# Patient Record
Sex: Male | Born: 1940 | Race: White | Hispanic: No | Smoking: Current some day smoker
Health system: Southern US, Community
[De-identification: ages and names within clinical notes are randomized; demographics above are authoritative.]

## PROBLEM LIST (undated history)

## (undated) DIAGNOSIS — B0050 Herpesviral ocular disease, unspecified: Secondary | ICD-10-CM

## (undated) DIAGNOSIS — E785 Hyperlipidemia, unspecified: Secondary | ICD-10-CM

## (undated) DIAGNOSIS — I4891 Unspecified atrial fibrillation: Secondary | ICD-10-CM

## (undated) DIAGNOSIS — I119 Hypertensive heart disease without heart failure: Secondary | ICD-10-CM

## (undated) DIAGNOSIS — I2510 Atherosclerotic heart disease of native coronary artery without angina pectoris: Secondary | ICD-10-CM

## (undated) DIAGNOSIS — I255 Ischemic cardiomyopathy: Secondary | ICD-10-CM

## (undated) DIAGNOSIS — I513 Intracardiac thrombosis, not elsewhere classified: Secondary | ICD-10-CM

## (undated) DIAGNOSIS — Z95810 Presence of automatic (implantable) cardiac defibrillator: Secondary | ICD-10-CM

## (undated) DIAGNOSIS — E119 Type 2 diabetes mellitus without complications: Secondary | ICD-10-CM

## (undated) HISTORY — PX: RETINAL DETACHMENT SURGERY: SHX105

## (undated) HISTORY — PX: CORNEAL TRANSPLANT: SHX108

## (undated) HISTORY — PX: ANAL FISSURECTOMY: SUR608

---

## 1999-07-14 HISTORY — PX: CORONARY ANGIOPLASTY WITH STENT PLACEMENT: SHX49

## 2011-07-24 DIAGNOSIS — I4891 Unspecified atrial fibrillation: Secondary | ICD-10-CM | POA: Diagnosis not present

## 2011-07-24 DIAGNOSIS — I2510 Atherosclerotic heart disease of native coronary artery without angina pectoris: Secondary | ICD-10-CM | POA: Diagnosis not present

## 2011-11-23 ENCOUNTER — Ambulatory Visit
Admission: RE | Admit: 2011-11-23 | Discharge: 2011-11-23 | Disposition: A | Discharge status: Home / Self Care | Payer: Medicare Other | Source: Ambulatory Visit | Attending: Cardiology | Admitting: Cardiology

## 2011-11-23 ENCOUNTER — Other Ambulatory Visit: Payer: Self-pay | Admitting: Cardiology

## 2011-11-23 DIAGNOSIS — E785 Hyperlipidemia, unspecified: Secondary | ICD-10-CM | POA: Diagnosis not present

## 2011-11-23 DIAGNOSIS — I2510 Atherosclerotic heart disease of native coronary artery without angina pectoris: Secondary | ICD-10-CM | POA: Diagnosis not present

## 2011-11-23 DIAGNOSIS — I10 Essential (primary) hypertension: Secondary | ICD-10-CM | POA: Diagnosis not present

## 2011-11-23 DIAGNOSIS — I2589 Other forms of chronic ischemic heart disease: Secondary | ICD-10-CM | POA: Diagnosis not present

## 2011-11-23 DIAGNOSIS — E889 Metabolic disorder, unspecified: Secondary | ICD-10-CM | POA: Diagnosis not present

## 2011-11-23 DIAGNOSIS — I43 Cardiomyopathy in diseases classified elsewhere: Secondary | ICD-10-CM | POA: Diagnosis not present

## 2011-11-23 DIAGNOSIS — I517 Cardiomegaly: Secondary | ICD-10-CM | POA: Diagnosis not present

## 2011-11-23 DIAGNOSIS — I4891 Unspecified atrial fibrillation: Secondary | ICD-10-CM | POA: Diagnosis not present

## 2011-12-02 DIAGNOSIS — I2510 Atherosclerotic heart disease of native coronary artery without angina pectoris: Secondary | ICD-10-CM | POA: Diagnosis not present

## 2011-12-02 DIAGNOSIS — I2589 Other forms of chronic ischemic heart disease: Secondary | ICD-10-CM | POA: Diagnosis not present

## 2011-12-02 DIAGNOSIS — E785 Hyperlipidemia, unspecified: Secondary | ICD-10-CM | POA: Diagnosis not present

## 2011-12-02 DIAGNOSIS — I10 Essential (primary) hypertension: Secondary | ICD-10-CM | POA: Diagnosis not present

## 2011-12-02 DIAGNOSIS — I4891 Unspecified atrial fibrillation: Secondary | ICD-10-CM | POA: Diagnosis not present

## 2012-05-24 DIAGNOSIS — I10 Essential (primary) hypertension: Secondary | ICD-10-CM | POA: Diagnosis not present

## 2012-05-24 DIAGNOSIS — I2510 Atherosclerotic heart disease of native coronary artery without angina pectoris: Secondary | ICD-10-CM | POA: Diagnosis not present

## 2012-05-24 DIAGNOSIS — I4891 Unspecified atrial fibrillation: Secondary | ICD-10-CM | POA: Diagnosis not present

## 2012-05-24 DIAGNOSIS — E785 Hyperlipidemia, unspecified: Secondary | ICD-10-CM | POA: Diagnosis not present

## 2012-05-24 DIAGNOSIS — I2589 Other forms of chronic ischemic heart disease: Secondary | ICD-10-CM | POA: Diagnosis not present

## 2012-07-30 ENCOUNTER — Emergency Department (HOSPITAL_COMMUNITY): Payer: Medicare Other

## 2012-07-30 ENCOUNTER — Inpatient Hospital Stay (HOSPITAL_COMMUNITY)
Admission: EM | Admit: 2012-07-30 | Discharge: 2012-08-03 | DRG: 225 | Disposition: A | Discharge status: Home / Self Care | Payer: Medicare Other | Attending: Cardiology | Admitting: Cardiology

## 2012-07-30 ENCOUNTER — Encounter (HOSPITAL_COMMUNITY): Payer: Self-pay | Admitting: *Deleted

## 2012-07-30 DIAGNOSIS — I469 Cardiac arrest, cause unspecified: Secondary | ICD-10-CM | POA: Diagnosis not present

## 2012-07-30 DIAGNOSIS — E785 Hyperlipidemia, unspecified: Secondary | ICD-10-CM | POA: Insufficient documentation

## 2012-07-30 DIAGNOSIS — I255 Ischemic cardiomyopathy: Secondary | ICD-10-CM

## 2012-07-30 DIAGNOSIS — I4901 Ventricular fibrillation: Secondary | ICD-10-CM | POA: Diagnosis not present

## 2012-07-30 DIAGNOSIS — I119 Hypertensive heart disease without heart failure: Secondary | ICD-10-CM | POA: Diagnosis present

## 2012-07-30 DIAGNOSIS — S0083XA Contusion of other part of head, initial encounter: Secondary | ICD-10-CM | POA: Diagnosis present

## 2012-07-30 DIAGNOSIS — I4891 Unspecified atrial fibrillation: Secondary | ICD-10-CM | POA: Diagnosis present

## 2012-07-30 DIAGNOSIS — I2589 Other forms of chronic ischemic heart disease: Secondary | ICD-10-CM | POA: Diagnosis not present

## 2012-07-30 DIAGNOSIS — Z95810 Presence of automatic (implantable) cardiac defibrillator: Secondary | ICD-10-CM | POA: Diagnosis not present

## 2012-07-30 DIAGNOSIS — R404 Transient alteration of awareness: Secondary | ICD-10-CM | POA: Diagnosis not present

## 2012-07-30 DIAGNOSIS — I700 Atherosclerosis of aorta: Secondary | ICD-10-CM | POA: Diagnosis not present

## 2012-07-30 DIAGNOSIS — S0003XA Contusion of scalp, initial encounter: Secondary | ICD-10-CM | POA: Diagnosis present

## 2012-07-30 DIAGNOSIS — I499 Cardiac arrhythmia, unspecified: Secondary | ICD-10-CM | POA: Diagnosis not present

## 2012-07-30 DIAGNOSIS — S00439A Contusion of unspecified ear, initial encounter: Secondary | ICD-10-CM | POA: Diagnosis not present

## 2012-07-30 DIAGNOSIS — I2510 Atherosclerotic heart disease of native coronary artery without angina pectoris: Secondary | ICD-10-CM | POA: Diagnosis present

## 2012-07-30 DIAGNOSIS — W19XXXA Unspecified fall, initial encounter: Secondary | ICD-10-CM | POA: Diagnosis present

## 2012-07-30 DIAGNOSIS — I513 Intracardiac thrombosis, not elsewhere classified: Secondary | ICD-10-CM | POA: Insufficient documentation

## 2012-07-30 DIAGNOSIS — I472 Ventricular tachycardia: Secondary | ICD-10-CM | POA: Diagnosis present

## 2012-07-30 DIAGNOSIS — R55 Syncope and collapse: Secondary | ICD-10-CM

## 2012-07-30 DIAGNOSIS — E119 Type 2 diabetes mellitus without complications: Secondary | ICD-10-CM | POA: Diagnosis present

## 2012-07-30 DIAGNOSIS — R0681 Apnea, not elsewhere classified: Secondary | ICD-10-CM | POA: Diagnosis not present

## 2012-07-30 HISTORY — DX: Unspecified atrial fibrillation: I48.91

## 2012-07-30 HISTORY — DX: Ischemic cardiomyopathy: I25.5

## 2012-07-30 HISTORY — DX: Atherosclerotic heart disease of native coronary artery without angina pectoris: I25.10

## 2012-07-30 HISTORY — DX: Intracardiac thrombosis, not elsewhere classified: I51.3

## 2012-07-30 HISTORY — DX: Presence of automatic (implantable) cardiac defibrillator: Z95.810

## 2012-07-30 HISTORY — DX: Herpesviral ocular disease, unspecified: B00.50

## 2012-07-30 HISTORY — DX: Type 2 diabetes mellitus without complications: E11.9

## 2012-07-30 HISTORY — DX: Hyperlipidemia, unspecified: E78.5

## 2012-07-30 HISTORY — DX: Hypertensive heart disease without heart failure: I11.9

## 2012-07-30 LAB — COMPREHENSIVE METABOLIC PANEL
AST: 20 U/L (ref 0–37)
Alkaline Phosphatase: 61 U/L (ref 39–117)
BUN: 18 mg/dL (ref 6–23)
CO2: 25 mEq/L (ref 19–32)
Calcium: 8.9 mg/dL (ref 8.4–10.5)
Chloride: 98 mEq/L (ref 96–112)
Creatinine, Ser: 1.23 mg/dL (ref 0.50–1.35)
Creatinine, Ser: 1.17 mg/dL (ref 0.50–1.35)
GFR calc Af Amer: 65 mL/min — ABNORMAL LOW (ref >90)
GFR calc Af Amer: 70 mL/min — ABNORMAL LOW (ref >90)
GFR calc non Af Amer: 56 mL/min — ABNORMAL LOW (ref >90)
Glucose, Bld: 251 mg/dL — ABNORMAL HIGH (ref 70–99)
Glucose, Bld: 204 mg/dL — ABNORMAL HIGH (ref 70–99)
Potassium: 4.5 mEq/L (ref 3.5–5.1)
Total Bilirubin: 0.8 mg/dL (ref 0.3–1.2)
Total Protein: 6 g/dL (ref 6.0–8.3)

## 2012-07-30 LAB — CBC
MCH: 31.6 pg (ref 26.0–34.0)
MCHC: 35.2 g/dL (ref 30.0–36.0)
MCV: 89.9 fL (ref 78.0–100.0)
Platelets: 171 10*3/uL (ref 150–400)
RBC: 4.93 MIL/uL (ref 4.22–5.81)

## 2012-07-30 LAB — HEMOGLOBIN A1C: Hgb A1c MFr Bld: 7.6 % — ABNORMAL HIGH (ref <5.7)

## 2012-07-30 LAB — MRSA PCR SCREENING: MRSA by PCR: NEGATIVE

## 2012-07-30 LAB — PROTIME-INR: INR: 1.21 (ref 0.00–1.49)

## 2012-07-30 LAB — MAGNESIUM: Magnesium: 1.8 mg/dL (ref 1.5–2.5)

## 2012-07-30 MED ORDER — HEPARIN BOLUS VIA INFUSION
4000.0000 [IU] | Freq: Once | INTRAVENOUS | Status: AC
  Administered 2012-07-30: 4000 [IU] via INTRAVENOUS

## 2012-07-30 MED ORDER — ASPIRIN 325 MG PO TABS
325.0000 mg | ORAL_TABLET | Freq: Once | ORAL | Status: AC
  Administered 2012-07-30: 325 mg via ORAL
  Filled 2012-07-30: qty 1

## 2012-07-30 MED ORDER — HEPARIN (PORCINE) IN NACL 100-0.45 UNIT/ML-% IJ SOLN
1200.0000 [IU]/h | INTRAMUSCULAR | Status: DC
  Administered 2012-07-30 – 2012-08-01 (×3): 1200 [IU]/h via INTRAVENOUS
  Filled 2012-07-30 (×3): qty 250

## 2012-07-30 MED ORDER — ASPIRIN EC 81 MG PO TBEC
81.0000 mg | DELAYED_RELEASE_TABLET | Freq: Every day | ORAL | Status: DC
  Administered 2012-07-31: 81 mg via ORAL
  Filled 2012-07-30 (×3): qty 1

## 2012-07-30 MED ORDER — METOPROLOL TARTRATE 50 MG PO TABS
50.0000 mg | ORAL_TABLET | Freq: Two times a day (BID) | ORAL | Status: DC
  Administered 2012-07-30 – 2012-08-01 (×6): 50 mg via ORAL
  Filled 2012-07-30 (×4): qty 1
  Filled 2012-07-30: qty 2
  Filled 2012-07-30 (×3): qty 1

## 2012-07-30 MED ORDER — ACETAMINOPHEN 325 MG PO TABS: 650.0000 mg | ORAL_TABLET | ORAL | Status: DC | PRN

## 2012-07-30 MED ORDER — INFLUENZA VIRUS VACC SPLIT PF IM SUSP
0.5000 mL | INTRAMUSCULAR | Status: AC
  Administered 2012-07-31: 0.5 mL via INTRAMUSCULAR
  Filled 2012-07-30: qty 0.5

## 2012-07-30 MED ORDER — PNEUMOCOCCAL VAC POLYVALENT 25 MCG/0.5ML IJ INJ
0.5000 mL | INJECTION | INTRAMUSCULAR | Status: AC
  Administered 2012-07-31: 0.5 mL via INTRAMUSCULAR
  Filled 2012-07-30: qty 0.5

## 2012-07-30 MED ORDER — SODIUM CHLORIDE 0.9 % IJ SOLN: 3.0000 mL | INTRAMUSCULAR | Status: DC | PRN

## 2012-07-30 MED ORDER — SODIUM CHLORIDE 0.9 % IJ SOLN
3.0000 mL | Freq: Two times a day (BID) | INTRAMUSCULAR | Status: DC
  Administered 2012-07-30 – 2012-08-03 (×7): 3 mL via INTRAVENOUS

## 2012-07-30 MED ORDER — ONDANSETRON HCL 4 MG/2ML IJ SOLN: 4.0000 mg | Freq: Four times a day (QID) | INTRAMUSCULAR | Status: DC | PRN

## 2012-07-30 MED ORDER — SODIUM CHLORIDE 0.9 % IV SOLN
250.0000 mL | INTRAVENOUS | Status: DC | PRN
  Administered 2012-07-30: 250 mL via INTRAVENOUS

## 2012-07-30 MED ORDER — NITROGLYCERIN 0.4 MG SL SUBL: 0.4000 mg | SUBLINGUAL_TABLET | SUBLINGUAL | Status: DC | PRN

## 2012-07-30 MED ORDER — NICOTINE 14 MG/24HR TD PT24
14.0000 mg | MEDICATED_PATCH | Freq: Every day | TRANSDERMAL | Status: DC
  Administered 2012-07-30 – 2012-08-03 (×5): 14 mg via TRANSDERMAL
  Filled 2012-07-30 (×5): qty 1

## 2012-07-30 NOTE — ED Notes (Signed)
Dr Donnie Aho here to eval pt

## 2012-07-30 NOTE — Consult Note (Signed)
ANTICOAGULATION CONSULT NOTE - F/U Consult  Pharmacy Consult for Heparin Indication: chest pain/ACS  Allergies  Allergen Reactions  . Lopid (Gemfibrozil)     "Ate away my muscles"  . Pradaxa (Dabigatran Etexilate Mesylate) Other (See Comments)    unknown    Patient Measurements: Height: 5\' 11"  (180.3 cm) Weight: 205 lb (92.987 kg) IBW/kg (Calculated) : 75.3  Heparin Dosing Weight: 92kg  Vital Signs: Temp: 97.5 F (36.4 C) (01/18 1938) Temp src: Oral (01/18 1938) BP: 118/77 mmHg (01/18 1700) Pulse Rate: 101  (01/18 2000)  Labs:  Basename 07/30/12 1933 07/30/12 1834 07/30/12 1251 07/30/12 1250 07/30/12 1032 07/30/12 1021  HGB -- -- -- -- -- 15.6  HCT -- -- -- -- -- 44.3  PLT -- -- -- -- -- 171  APTT -- -- -- 161* -- --  LABPROT -- -- -- -- -- 15.1  INR -- -- -- -- -- 1.21  HEPARINUNFRC 0.32 -- -- -- -- --  CREATININE -- -- -- 1.17 -- 1.23  CKTOTAL -- -- -- -- -- --  CKMB -- -- -- -- -- --  TROPONINI -- <0.30 <0.30 -- <0.30 --    Estimated Creatinine Clearance: 65.5 ml/min (by C-G formula based on Cr of 1.17).   Medical History: Past Medical History  Diagnosis Date  . CAD (coronary artery disease) 07/30/2012    Inferior MI 2001 Stent to LAD and circ 2001 Refused cath 2008 in [place] Ischemic cardiomyopathy   . Ischemic cardiomyopathy 07/30/2012    Present since 2001 EKF 20% by ECHO 12/02/11   . Atrial fibrillation     Diagnosed 2008, Tried Pradaxa and Xarelto that caused hematuria Refuses warfarin   . Hypertensive heart disease without CHF   . Left atrial thrombus     He developed atrial fibrillation 2008 and was found to have a mass in his atrium on a CT scan. Confirmed by TEE that also showed spontaneous contrast.  He was placed on Pradaxa and had 2 TEE's  that eventually showed some reduction in the size of his thrombus but he refused to have a third TEE. He was taken off of Pradaxa and later placed on Xarelto but developed hematuria on Xarelto and quit taking  this. He refused to consider going on warfarin and was willing to take the risks of stroke being on just aspirin alone   . Hyperlipidemia   . Herpes simplex of eye     Medications:  No anticoagulants pta  Assessment: 71yom with hx CAD s/p 4 stents and afib is now s/p cardiac arrest with return of pulses after 2 rounds of CPR and 2 shocks. Heparin level 0.32 in gaol.  Goal of Therapy:  Heparin level 0.3-0.7 units/ml Monitor platelets by anticoagulation protocol: Yes   Plan:  Next HL with am labs. Continue heparin 1200 units/hr  Misty Stanley Stillinger 07/30/2012,8:41 PM

## 2012-07-30 NOTE — H&P (Signed)
Tyler Cameron    Date of visit:  07/30/2012 DOB:  02/08/1941    Age:  71 yrs. Medical record number:  [id number]     Account number:  [id number] Primary Care Provider: Erick Blinks  CURRENT DIAGNOSES  1. Cardiac Arrest  2. Cardiomyopathy Ischemic  3. CAD,Native  4. Arrhythmia-Atrial Fibrillation  5. Hyperlipidemia  6. Hypertension,Essential (Benign)  ALLERGIES  Lopid, Muscle weakness  Pradaxa, Heartburn  Reopro, Thrombocytopenia  Xarelto, Hematuria  MEDICATIONS  1. nitroglycerin 0.4 mg tablet, sublingual, PRN  2. triamcinolone acetonide 0.1 % Ointment, PRN  3. multivitamin tablet, 1 p.o. daily  4. aspirin 81 mg tablet, chewable, 1 p.o. daily  5. metoprolol tartrate 50 mg tablet, BID  6. digoxin 125 mcg tablet, 1 p.o. daily  7. lisinopril 10 mg tablet, 1 p.o. daily  8. pravastatin 40 mg tablet, 1 p.o. daily  HISTORY OF PRESENT ILLNESS  This 72 year old male is admitted to the hospital following out of hospital cardiac arrest. He moved here from [place] where he previously lived in a cardiac chair and establish care with me in May. His cardiac history dates back to 2001 when he was found to have coronary disease and stenting of the LAD and was noted to have an occluded right coronary artery. He has had depressed LV function since then and has refused to have a defibrillator placed for prevention of sudden death. He developed atrial fibrillation in 2008. During that hospitalization he refused to have a catheterization and he has had serial echocardiograms that showed ejection fractions in the 20s. He developed atrial fibrillation several years ago and was found to have a mass in his atrium incidentally on a CT scan. He was placed on Pradaxa and had 2 TEE 's that eventually showed some reduction in the size of his thrombus but he refused to have a third TEE. He was taken off of Pradaxa and later placed on Xarelto but developed hematuria on Xarelto and he quit taking this. He refused to  consider going on warfarin and was willing to take the risks of stroke being on just aspirin alone. He evidently had a Cardiolite test done a year ago that did not show significant ischemia but did show an ejection fraction of 22%.  He had an echocardiogram done in May of 2013 that showed an ejection fraction of 20%. I have spoken to him twice about having a defibrillator he stated that he was not interested in this. He stated he was asymptomatic at the time that I saw him in November and again stressed a wish not to have a defibrillator. I suggested taking a statin drug at the time.  He was in his usual state of health and went with his family to the aquatics event [place] today. After walking briskly from the parking lot he complained of some dyspnea and then had a syncopal episode. A physician was present and he was found to be in ventricular fibrillation and was resuscitated after having had 2 bouts of CPR. He was found to have lateral ST depression. On awakening he had no chest discomfort and other than feeling sore in his chest stated that he felt reasonably well. He did fell and struck his head and had some slight bleeding from the pinna of his ear.  Last week he felt fine except he had a mild GI disorder for which he took some castor oil and Pepcid is small. He has had no recent changes in his medication. He denies exertional angina and  has no PND, orthopnea or claudication.  PAST HISTORY  Past Medical Illnesses:  hypertension, hyperlipidemia, history of herpes simplex of eye;  Cardiovascular Illnesses:  CAD, atrial fibrillation, cardiomyopathy(ischemic), history of left atrial thrombus;  Surgical Procedures:  anal fissurotomy, corneal transplant, detachet retina;  NYHA Classification:  II;  Cardiology Procedures-Invasive:  cardiac cath (left), stent to LAD and circ 2001;  Cardiology Procedures-Noninvasive:  lexiscan cardiolite April 2010, TEE October 2011, echocardiogram May 2013;  LVEF of 20%  documented via echocardiogram on 12/02/2011  CARDIO-PULMONARY TEST DATES EKG Date:  11/23/2011;  Nuclear Study Date:  10/24/2010;  Echocardiography Date: 12/02/2011;  Chest Xray Date: 11/23/2011;    FAMILY HISTORY Father - age 54,  died of heart disease; Mother - age 43,  deceased; Brother 1 - age 3,  alive and well; Sister 1 - age 70,  alive and well and  diabetes-unknown type; Sister 2 - age 55,  alive and well; Sister 75 - age 20, died of drug and seld abuse;   SOCIAL HISTORY Alcohol Use:  occasionally;  Smoking:  50 pack year history;  Diet:  regular diet;  Lifestyle:  separated;  Exercise:  treadmill for approximately 30 minutes 3 days per week;  Occupation:  retired [employer]or, [employer];  Residence:  lives alone;    REVIEW OF SYSTEMS General:  denies recent weight change, fatique or change in exercise tolerance.Integumentary:  dry skin  Eyes:  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 Respirations:  12/min. Weight:  206.00 lbs. Height:  71"BMI: 29  Constitutional:  pleasant bearded white male in no acute distress Skin:  hematoma of right pinna Head:  normocephalic, normal hair pattern, no masses or tenderness Eyes:  left eye cornea cloudy ENT:  ears, nose and throat reveal no gross abnormalities.  Dentition good. Neck:  supple, without massess. No JVD, thyromegaly or carotid bruits. Carotid upstroke normal. Chest:  normal symmetry, clear to auscultation and percussion. Cardiac:  irregularly irregular rhythm, normal S1 and S2, no S3 or S4, no murmur Abdomen:  abdomen  soft,non-tender, no masses, no hepatospenomegaly, or aneurysm noted Peripheral Pulses:  pulses below the femoral arteries are diminished Extremities & Back:  no deformities, clubbing, cyanosis, erythema or edema observed. Normal muscle strength and tone. Neurological:  no gross motor or sensory deficits noted, affect appropriate, oriented x3.  MOST RECENT LIPID PANEL 12/02/11  CHOL TOTL 196 mg/dl, LDL 409 calc, HDL 35 mg/dl, TRIGLYCER 811 mg/dl and CHOL/HDL 5.6 (Calc)  IMPRESSIONS/PLAN  1. Out-of-hospital cardiac arrest with successful resuscitation 2. Ischemic cardiomyopathy 3. Coronary artery disease with previous inferior infarction and multiple stenting 4. Hypertensive heart disease 5. Hyperlipidemia with statin and gemfibrozil intolerance 6. Chronic atrial fibrillation 7. History of left atrial thrombus and spontaneous echo contrast  Recommendations:  Likely had a ventricular arrhythmia due to his LV dysfunction. He will be placed on intravenous heparin. He will have serial cardiac enzymes. Ideally he would need to have a repeat catheterization to assess for recurrent ischemia and would be recommended to have a implantable defibrillator if he is willing to have one.   Cardiology Physician:  Darden Palmer MD [facility]

## 2012-07-30 NOTE — Consult Note (Signed)
ANTICOAGULATION CONSULT NOTE - Initial Consult  Pharmacy Consult for Heparin Indication: chest pain/ACS  Allergies  Allergen Reactions  . Lopid (Gemfibrozil)     "Ate away my muscles"    Patient Measurements: Height: 5\' 11"  (180.3 cm) Weight: 205 lb (92.987 kg) IBW/kg (Calculated) : 75.3  Heparin Dosing Weight: 92kg  Vital Signs: Temp: 97.4 F (36.3 C) (01/18 1022) Temp src: Oral (01/18 1022) BP: 100/66 mmHg (01/18 1022) Pulse Rate: 79  (01/18 1022)  Labs: No results found for this basename: HGB:2,HCT:3,PLT:3,APTT:3,LABPROT:3,INR:3,HEPARINUNFRC:3,CREATININE:3,CKTOTAL:3,CKMB:3,TROPONINI:3 in the last 72 hours  CrCl is unknown because no creatinine reading has been taken.   Medical History: Past Medical History  Diagnosis Date  . A-fib   . Coronary artery disease   . Hypertension     Medications:  No anticoagulants pta  Assessment: 71yom with hx CAD s/p 4 stents and afib is now s/p cardiac arrest with return of pulses after 2 rounds of CPR and 2 shocks. He will begin heparin and may go to the cath lab later today. Baseline CBC, renal function wnl.  Of note, patient has been on both pradaxa and xarelto in the past. Pradaxa was stopped due to stomach cramps and xarelto was stopped due to "blood everywhere" per the patient.  Goal of Therapy:  Heparin level 0.3-0.7 units/ml Monitor platelets by anticoagulation protocol: Yes   Plan:  1) Heparin bolus 4000 units x 1 2) Heparin drip at 1200 units/hr 3) 8 hour heparin level vs f/u after cath 4) Daily heparin level and CBC  Fredrik Rigger 07/30/2012,10:56 AM

## 2012-07-30 NOTE — ED Provider Notes (Signed)
History     CSN: [id number]  Arrival date & time 07/30/12  1014   First MD Initiated Contact with Patient 07/30/12 1016      Chief Complaint  Patient presents with  . Cardiac Arrest     The history is provided by the patient and the EMS personnel.   the patient had a syncopal event and was noted to be pulseless and apneic.  CPR was initiated immediately the patient was related twice by an AED with return of spontaneous circulation.  The patient returned to normal mentation was alert and oriented without any complaints.  He denies preceding chest pain or chest tightness.  The patient does have a cardiac history with 4 stents in his heart placed in 2001.  EMS reports some lateral ST depression on EKG but no ST elevation.  The patient reports her last several days he's been without any complaints.  He has no complaints at this time.  Small hematoma and laceration noted to his right ear.  Patient arrived backboarded in c-collar.  The patient denies neck pain.  Weakness his upper lower extremities.  Is not on any anticoagulants except for an aspirin.  He denies headache or neck pain.  He has no recollection of the syncopal event.  Past Medical History  Diagnosis Date  . A-fib   . Coronary artery disease   . Hypertension     Past Surgical History  Procedure Date  . Coronary angioplasty with stent placement     History reviewed. No pertinent family history.  History  Substance Use Topics  . Smoking status: Not on file  . Smokeless tobacco: Not on file  . Alcohol Use:       Review of Systems  All other systems reviewed and are negative.    Allergies  Review of patient's allergies indicates no known allergies.  Home Medications  No current outpatient prescriptions on file.  BP 100/66  Pulse 79  Temp 97.4 F (36.3 C) (Oral)  Resp 33  SpO2 95%  Physical Exam  Nursing note and vitals reviewed. Constitutional: He is oriented to person, place, and time. He appears  well-developed and well-nourished.  HENT:  Head: Normocephalic and atraumatic.  Eyes: EOM are normal.  Neck: Normal range of motion.  Cardiovascular: Regular rhythm, normal heart sounds and intact distal pulses.        Irregularly irregular  Pulmonary/Chest: Effort normal and breath sounds normal. No respiratory distress.  Abdominal: Soft. He exhibits no distension. There is no tenderness.  Musculoskeletal: Normal range of motion.  Neurological: He is alert and oriented to person, place, and time.  Skin: Skin is warm and dry.  Psychiatric: He has a normal mood and affect. Judgment normal.    ED Course  Procedures (including critical care time)   Date: 07/30/2012  Rate: 87  Rhythm: normal sinus rhythm  QRS Axis: normal  Intervals: normal  ST/T Wave abnormalities: Lateral ST depression  Conduction Disutrbances: none  Narrative Interpretation:   Old EKG Reviewed: No prior EKG available   CRITICAL CARE Performed by: Lyanne Co Total critical care time: 30 Critical care time was exclusive of separately billable procedures and treating other patients. Critical care was necessary to treat or prevent imminent or life-threatening deterioration. Critical care was time spent personally by me on the following activities: development of treatment plan with patient and/or surrogate as well as nursing, discussions with consultants, evaluation of patient's response to treatment, examination of patient, obtaining history from patient or surrogate,  ordering and performing treatments and interventions, ordering and review of laboratory studies, ordering and review of radiographic studies, pulse oximetry and re-evaluation of patient's condition.   Labs Reviewed  COMPREHENSIVE METABOLIC PANEL - Abnormal; Notable for the following:    Glucose, Bld 251 (*)     Albumin 3.2 (*)     GFR calc non Af Amer 56 (*)     GFR calc Af Amer 65 (*)     All other components within normal limits  APTT -  Abnormal; Notable for the following:    aPTT 161 (*)     All other components within normal limits  TSH - Abnormal; Notable for the following:    TSH 4.610 (*)     All other components within normal limits  COMPREHENSIVE METABOLIC PANEL - Abnormal; Notable for the following:    Glucose, Bld 204 (*)     GFR calc non Af Amer 60 (*)     GFR calc Af Amer 70 (*)     All other components within normal limits  HEMOGLOBIN A1C - Abnormal; Notable for the following:    Hemoglobin A1C 7.6 (*)     Mean Plasma Glucose 171 (*)     All other components within normal limits  PRO B NATRIURETIC PEPTIDE - Abnormal; Notable for the following:    Pro B Natriuretic peptide (BNP) 1889.0 (*)     All other components within normal limits  CBC - Abnormal; Notable for the following:    WBC 11.6 (*)     All other components within normal limits  LIPID PANEL - Abnormal; Notable for the following:    HDL 37 (*)     LDL Cholesterol 113 (*)     All other components within normal limits  CBC  PROTIME-INR  MAGNESIUM  TROPONIN I  TROPONIN I  TROPONIN I  TROPONIN I  HEPARIN LEVEL (UNFRACTIONATED)  MRSA PCR SCREENING  HEPARIN LEVEL (UNFRACTIONATED)   Dg Chest Portable 1 View  07/30/2012  *RADIOLOGY REPORT*  Clinical Data: Arrhythmia, post CPR  PORTABLE CHEST - 1 VIEW  Comparison: 11/23/2011  Findings:  Grossly unchanged cardiac silhouette and mediastinal contours with atherosclerotic calcifications within the thoracic aorta. Transcutaneous pacer devices overlie the left heart apex. No focal airspace opacities.  No evidence of pulmonary edema.  No pleural effusion or pneumothorax.  Unchanged bones.  IMPRESSION: No acute cardiopulmonary disease, specifically, no definite evidence of edema   Original Report Authenticated By: Tacey Ruiz, MD    I personally reviewed the imaging tests through PACS system I reviewed available ER/hospitalization records through the EMR   1. Syncope, cardiogenic       MDM  Post  CPR. Suspected malignant arrhythmia given a ED shock x2.  The patient had no preceding cardiac symptoms.  He has no cardiac symptoms at this time.  He does have ST depression in V5 and V6 and does appear to have atrial fibrillation on his EKG.  Note prior EKGs available for comparison.  The patient had more profound ST depression laterally on his initial EMS EKG.  No ST elevation present.  Aspirin now.  Heparin now.  Labs and chest x-ray pending C-spine cleared by Nexus criteria.  Small hematoma to his right auricle.  This will require compression dressing.  No indication for head CT at this time.  Have updated the patient and family.  Awaiting callback from cardiology.  Based on symptoms at this time the patient does not need to go emergently  to the catheter lab however he may benefit from urgent heart catheterization to evaluate for obstructing lesion.  The patient will likely require an ICD placement during this hospitalization        Lyanne Co, MD 07/31/12 [id number]

## 2012-07-30 NOTE — ED Notes (Signed)
Pt arrived by [facility]. [place], [place], cardio md was present and found pt pulseless and apneic, did cpr x 2 rounds and defib x 2. Pt a&o on arrival to ed.

## 2012-07-31 LAB — LIPID PANEL
Cholesterol: 179 mg/dL (ref 0–200)
HDL: 37 mg/dL — ABNORMAL LOW (ref >39)
Total CHOL/HDL Ratio: 4.8 RATIO
Triglycerides: 144 mg/dL (ref <150)
VLDL: 29 mg/dL (ref 0–40)

## 2012-07-31 LAB — HEPARIN LEVEL (UNFRACTIONATED): Heparin Unfractionated: 0.41 IU/mL (ref 0.30–0.70)

## 2012-07-31 LAB — CBC
Hemoglobin: 15.5 g/dL (ref 13.0–17.0)
RBC: 4.92 MIL/uL (ref 4.22–5.81)
WBC: 11.6 10*3/uL — ABNORMAL HIGH (ref 4.0–10.5)

## 2012-07-31 MED ORDER — SODIUM CHLORIDE 0.9 % IV SOLN
1.0000 mL/kg/h | INTRAVENOUS | Status: DC
  Administered 2012-08-01: 1 mL/kg/h via INTRAVENOUS

## 2012-07-31 MED ORDER — DIAZEPAM 5 MG PO TABS
10.0000 mg | ORAL_TABLET | ORAL | Status: AC
  Administered 2012-08-01: 10 mg via ORAL
  Filled 2012-07-31 (×2): qty 1

## 2012-07-31 MED ORDER — BACITRACIN ZINC 500 UNIT/GM EX OINT
1.0000 "application " | TOPICAL_OINTMENT | Freq: Two times a day (BID) | CUTANEOUS | Status: DC
  Administered 2012-07-31 – 2012-08-03 (×6): 1 via TOPICAL
  Filled 2012-07-31 (×2): qty 15

## 2012-07-31 NOTE — Consult Note (Signed)
ANTICOAGULATION CONSULT NOTE - F/U Consult  Pharmacy Consult for Heparin Indication: chest pain/ACS  Allergies  Allergen Reactions  . Lopid (Gemfibrozil)     "Ate away my muscles"  . Pradaxa (Dabigatran Etexilate Mesylate) Other (See Comments)    unknown    Patient Measurements: Height: 5\' 11"  (180.3 cm) Weight: 205 lb (92.987 kg) IBW/kg (Calculated) : 75.3  Heparin Dosing Weight: 92kg  Vital Signs: Temp: 97.7 F (36.5 C) (01/18 2300) Temp src: Oral (01/18 2300) BP: 131/72 mmHg (01/19 0100) Pulse Rate: 127  (01/19 0100)  Labs:  Basename 07/31/12 0515 07/30/12 2340 07/30/12 1933 07/30/12 1834 07/30/12 1251 07/30/12 1250 07/30/12 1021  HGB 15.5 -- -- -- -- -- 15.6  HCT 44.1 -- -- -- -- -- 44.3  PLT 167 -- -- -- -- -- 171  APTT -- -- -- -- -- 161* --  LABPROT -- -- -- -- -- -- 15.1  INR -- -- -- -- -- -- 1.21  HEPARINUNFRC 0.41 -- 0.32 -- -- -- --  CREATININE -- -- -- -- -- 1.17 1.23  CKTOTAL -- -- -- -- -- -- --  CKMB -- -- -- -- -- -- --  TROPONINI -- <0.30 -- <0.30 <0.30 -- --    Estimated Creatinine Clearance: 65.5 ml/min (by C-G formula based on Cr of 1.17).   Medical History: Past Medical History  Diagnosis Date  . CAD (coronary artery disease) 07/30/2012    Inferior MI 2001 Stent to LAD and circ 2001 Refused cath 2008 in [place] Ischemic cardiomyopathy   . Ischemic cardiomyopathy 07/30/2012    Present since 2001 EKF 20% by ECHO 12/02/11   . Atrial fibrillation     Diagnosed 2008, Tried Pradaxa and Xarelto that caused hematuria Refuses warfarin   . Hypertensive heart disease without CHF   . Left atrial thrombus     He developed atrial fibrillation 2008 and was found to have a mass in his atrium on a CT scan. Confirmed by TEE that also showed spontaneous contrast.  He was placed on Pradaxa and had 2 TEE's  that eventually showed some reduction in the size of his thrombus but he refused to have a third TEE. He was taken off of Pradaxa and later placed on  Xarelto but developed hematuria on Xarelto and quit taking this. He refused to consider going on warfarin and was willing to take the risks of stroke being on just aspirin alone   . Hyperlipidemia   . Herpes simplex of eye     Medications:  No anticoagulants pta  Assessment: 71yom with hx CAD s/p 4 stents and afib is now s/p cardiac arrest with return of pulses after 2 rounds of CPR and 2 shocks. Heparin level remains therapeutic.  Goal of Therapy:  Heparin level 0.3-0.7 units/ml Monitor platelets by anticoagulation protocol: Yes   Plan:  Continue heparin 1200 units/hr  Reilyn Nelson Poteet 07/31/2012,6:17 AM

## 2012-07-31 NOTE — Progress Notes (Signed)
Subjective:  Not SOB, denies angina or ischemic pain.  C/o chest wall soreness  Objective:  Vital Signs in the last 24 hours: BP 115/74  Pulse 46  Temp 97.7 F (36.5 C) (Oral)  Resp 14  Ht 5\' 11"  (1.803 m)  Wt 92.987 kg (205 lb)  BMI 28.59 kg/m2  SpO2 97%  Physical Exam: Pleasant WM in NAD Ear:  Large hematoma right pinna, non tender Lungs:  Clear  Cardiac:  irregular rhythm, normal S1 and S2, no S3 Abdomen:  Soft, nontender, no masses Extremities:  No edema present  Intake/Output from previous day: 01/18 0701 - 01/19 0700 In: 1124 [P.O.:780; I.V.:344] Out: 1150 [Urine:1150]  Weight Filed Weights   07/30/12 1039  Weight: 92.987 kg (205 lb)    Lab Results: Basic Metabolic Panel:  Basename 07/30/12 1250 07/30/12 1021  NA 136 136  K 4.5 4.3  CL 98 98  CO2 27 25  GLUCOSE 204* 251*  BUN 18 19  CREATININE 1.17 1.23   CBC:  Basename 07/31/12 0515 07/30/12 1021  WBC 11.6* 10.3  NEUTROABS -- --  HGB 15.5 15.6  HCT 44.1 44.3  MCV 89.6 89.9  PLT 167 171   Cardiac Enzymes:  Basename 07/30/12 2340 07/30/12 1834 07/30/12 1251  CKTOTAL -- -- --  CKMB -- -- --  CKMBINDEX -- -- --  TROPONINI <0.30 <0.30 <0.30    Telemetry: Atrial fib with occasional PVC's - no VT  Assessment/Plan:  1. Cardiac Arrest witnessed  good recovery with no neuro deficits 2. Cardiomyopathy Ischemic  3. CAD,Native  4. Arrhythmia-Atrial Fibrillation 5. Ear hematoma -  Will ask ENT to see and may need to stop heparin  Rec:  Discussed with EP.  They will see tomorrow.  Plan cardiac cath to assess anatomy.  Cardiac catheterization was discussed with the patient fully including risks of myocardial infarction, death, stroke, bleeding, arrhythmia, dye allergy, renal insufficiency or bleeding.  The patient understands and is willing to proceed. Will need defibrillator post cath results know.  Check ECHO today.  Watch right ear.     Darden Palmer  MD  [facility]  07/31/2012, 8:11 AM

## 2012-07-31 NOTE — Progress Notes (Signed)
  Echocardiogram 2D Echocardiogram has been performed.  Cathie Beams 07/31/2012, 10:55 AM

## 2012-07-31 NOTE — Consult Note (Signed)
Tyler Cameron, Tyler Cameron 72 y.o., male [id number]     Chief Complaint: RIGHT ear injury  HPI: 72 yo wm suffered cardiac arrest with syncope and fall yesterday AM.   Struck the pinna.  Now on Heparin with no obvious increase in ear problems.    PMH: Past Medical History  Diagnosis Date  . CAD (coronary artery disease) 07/30/2012    Inferior MI 2001 Stent to LAD and circ 2001 Refused cath 2008 in [place] Ischemic cardiomyopathy   . Ischemic cardiomyopathy 07/30/2012    Present since 2001 EKF 20% by ECHO 12/02/11   . Atrial fibrillation     Diagnosed 2008, Tried Pradaxa and Xarelto that caused hematuria Refuses warfarin   . Hypertensive heart disease without CHF   . Left atrial thrombus     He developed atrial fibrillation 2008 and was found to have a mass in his atrium on a CT scan. Confirmed by TEE that also showed spontaneous contrast.  He was placed on Pradaxa and had 2 TEE's  that eventually showed some reduction in the size of his thrombus but he refused to have a third TEE. He was taken off of Pradaxa and later placed on Xarelto but developed hematuria on Xarelto and quit taking this. He refused to consider going on warfarin and was willing to take the risks of stroke being on just aspirin alone   . Hyperlipidemia   . Herpes simplex of eye     Surg Hx: Past Surgical History  Procedure Date  . Coronary angioplasty with stent placement   . Anal fissurectomy   . Corneal transplant   . Retinal detachment surgery     FHx:  History reviewed. No pertinent family history. SocHx:  reports that he has been smoking Cigarettes.  He has a 50 pack-year smoking history. He does not have any smokeless tobacco history on file. He reports that he drinks about 1.5 ounces of alcohol per week. He reports that he does not use illicit drugs.  ALLERGIES:  Allergies  Allergen Reactions  . Lopid (Gemfibrozil)     "Ate away my muscles"  . Pradaxa (Dabigatran Etexilate Mesylate) Other (See Comments)   unknown    Medications Prior to Admission  Medication Sig Dispense Refill  . aspirin EC 81 MG tablet Take 81 mg by mouth daily.      . digoxin (LANOXIN) 0.125 MG tablet Take 0.125 mg by mouth daily.      Marland Kitchen lisinopril (PRINIVIL,ZESTRIL) 10 MG tablet Take 10 mg by mouth daily.      . metoprolol tartrate (LOPRESSOR) 25 MG tablet Take 25 mg by mouth 2 (two) times daily.        Results for orders placed during the hospital encounter of 07/30/12 (from the past 48 hour(s))  CBC     Status: Normal   Collection Time   07/30/12 10:21 AM      Component Value Range Comment   WBC 10.3  4.0 - 10.5 K/uL    RBC 4.93  4.22 - 5.81 MIL/uL    Hemoglobin 15.6  13.0 - 17.0 g/dL    HCT 62.1  30.8 - 65.7 %    MCV 89.9  78.0 - 100.0 fL    MCH 31.6  26.0 - 34.0 pg    MCHC 35.2  30.0 - 36.0 g/dL    RDW 84.6  96.2 - 95.2 %    Platelets 171  150 - 400 K/uL   COMPREHENSIVE METABOLIC PANEL     Status: Abnormal  Collection Time   07/30/12 10:21 AM      Component Value Range Comment   Sodium 136  135 - 145 mEq/L    Potassium 4.3  3.5 - 5.1 mEq/L    Chloride 98  96 - 112 mEq/L    CO2 25  19 - 32 mEq/L    Glucose, Bld 251 (*) 70 - 99 mg/dL    BUN 19  6 - 23 mg/dL    Creatinine, Ser 4.54  0.50 - 1.35 mg/dL    Calcium 8.9  8.4 - 09.8 mg/dL    Total Protein 6.0  6.0 - 8.3 g/dL    Albumin 3.2 (*) 3.5 - 5.2 g/dL    AST 20  0 - 37 U/L    ALT 20  0 - 53 U/L    Alkaline Phosphatase 57  39 - 117 U/L    Total Bilirubin 0.7  0.3 - 1.2 mg/dL    GFR calc non Af Amer 56 (*) >90 mL/min    GFR calc Af Amer 65 (*) >90 mL/min   PROTIME-INR     Status: Normal   Collection Time   07/30/12 10:21 AM      Component Value Range Comment   Prothrombin Time 15.1  11.6 - 15.2 seconds    INR 1.21  0.00 - 1.49   MAGNESIUM     Status: Normal   Collection Time   07/30/12 10:21 AM      Component Value Range Comment   Magnesium 1.8  1.5 - 2.5 mg/dL   TROPONIN I     Status: Normal   Collection Time   07/30/12 10:32 AM       Component Value Range Comment   Troponin I <0.30  <0.30 ng/mL   APTT     Status: Abnormal   Collection Time   07/30/12 12:50 PM      Component Value Range Comment   aPTT 161 (*) 24 - 37 seconds   TSH     Status: Abnormal   Collection Time   07/30/12 12:50 PM      Component Value Range Comment   TSH 4.610 (*) 0.350 - 4.500 uIU/mL   COMPREHENSIVE METABOLIC PANEL     Status: Abnormal   Collection Time   07/30/12 12:50 PM      Component Value Range Comment   Sodium 136  135 - 145 mEq/L    Potassium 4.5  3.5 - 5.1 mEq/L    Chloride 98  96 - 112 mEq/L    CO2 27  19 - 32 mEq/L    Glucose, Bld 204 (*) 70 - 99 mg/dL    BUN 18  6 - 23 mg/dL    Creatinine, Ser 1.19  0.50 - 1.35 mg/dL    Calcium 9.0  8.4 - 14.7 mg/dL    Total Protein 6.3  6.0 - 8.3 g/dL    Albumin 3.6  3.5 - 5.2 g/dL    AST 22  0 - 37 U/L    ALT 21  0 - 53 U/L    Alkaline Phosphatase 61  39 - 117 U/L    Total Bilirubin 0.8  0.3 - 1.2 mg/dL    GFR calc non Af Amer 60 (*) >90 mL/min    GFR calc Af Amer 70 (*) >90 mL/min   HEMOGLOBIN A1C     Status: Abnormal   Collection Time   07/30/12 12:50 PM      Component Value Range Comment  Hemoglobin A1C 7.6 (*) <5.7 %    Mean Plasma Glucose 171 (*) <117 mg/dL   PRO B NATRIURETIC PEPTIDE     Status: Abnormal   Collection Time   07/30/12 12:51 PM      Component Value Range Comment   Pro B Natriuretic peptide (BNP) 1889.0 (*) 0 - 125 pg/mL   TROPONIN I     Status: Normal   Collection Time   07/30/12 12:51 PM      Component Value Range Comment   Troponin I <0.30  <0.30 ng/mL   MRSA PCR SCREENING     Status: Normal   Collection Time   07/30/12  4:00 PM      Component Value Range Comment   MRSA by PCR NEGATIVE  NEGATIVE   TROPONIN I     Status: Normal   Collection Time   07/30/12  6:34 PM      Component Value Range Comment   Troponin I <0.30  <0.30 ng/mL   HEPARIN LEVEL (UNFRACTIONATED)     Status: Normal   Collection Time   07/30/12  7:33 PM      Component Value Range  Comment   Heparin Unfractionated 0.32  0.30 - 0.70 IU/mL   TROPONIN I     Status: Normal   Collection Time   07/30/12 11:40 PM      Component Value Range Comment   Troponin I <0.30  <0.30 ng/mL   HEPARIN LEVEL (UNFRACTIONATED)     Status: Normal   Collection Time   07/31/12  5:15 AM      Component Value Range Comment   Heparin Unfractionated 0.41  0.30 - 0.70 IU/mL   CBC     Status: Abnormal   Collection Time   07/31/12  5:15 AM      Component Value Range Comment   WBC 11.6 (*) 4.0 - 10.5 K/uL    RBC 4.92  4.22 - 5.81 MIL/uL    Hemoglobin 15.5  13.0 - 17.0 g/dL    HCT 16.1  09.6 - 04.5 %    MCV 89.6  78.0 - 100.0 fL    MCH 31.5  26.0 - 34.0 pg    MCHC 35.1  30.0 - 36.0 g/dL    RDW 40.9  81.1 - 91.4 %    Platelets 167  150 - 400 K/uL   LIPID PANEL     Status: Abnormal   Collection Time   07/31/12  5:15 AM      Component Value Range Comment   Cholesterol 179  0 - 200 mg/dL    Triglycerides 782  <956 mg/dL    HDL 37 (*) >21 mg/dL    Total CHOL/HDL Ratio 4.8      VLDL 29  0 - 40 mg/dL    LDL Cholesterol 308 (*) 0 - 99 mg/dL    Dg Chest Portable 1 View  07/30/2012  *RADIOLOGY REPORT*  Clinical Data: Arrhythmia, post CPR  PORTABLE CHEST - 1 VIEW  Comparison: 11/23/2011  Findings:  Grossly unchanged cardiac silhouette and mediastinal contours with atherosclerotic calcifications within the thoracic aorta. Transcutaneous pacer devices overlie the left heart apex. No focal airspace opacities.  No evidence of pulmonary edema.  No pleural effusion or pneumothorax.  Unchanged bones.  IMPRESSION: No acute cardiopulmonary disease, specifically, no definite evidence of edema   Original Report Authenticated By: Tacey Ruiz, MD      Blood pressure 138/73, pulse 60, temperature 98 F (36.7 C), temperature source Oral,  resp. rate 18, height 5\' 11"  (1.803 m), weight 92.987 kg (205 lb), SpO2 98.00%.  PHYSICAL EXAM: RIGHT pinna with some excoriations and superficial crusted blood.  Skin is  thickened and ecchymotic, but no obvious fluid collection.  No involvement of ear canal. No significant laceration.    Assessment/Plan RIGHT pinna ecchymosis without hematoma.    Recommend wound hygiene, ice x 24 hrs, antibiotic ointment.  OK to continue Heparin for clinically higher priority conditions.  I will follow with you.  Flo Shanks 07/31/2012, 10:57 AM

## 2012-08-01 ENCOUNTER — Encounter (HOSPITAL_COMMUNITY)
Admission: EM | Disposition: A | Discharge status: Home / Self Care | Payer: Self-pay | Source: Home / Self Care | Attending: Cardiology

## 2012-08-01 DIAGNOSIS — I469 Cardiac arrest, cause unspecified: Secondary | ICD-10-CM

## 2012-08-01 HISTORY — PX: LEFT HEART CATHETERIZATION WITH CORONARY ANGIOGRAM: SHX5451

## 2012-08-01 LAB — CBC
HCT: 43.6 % (ref 39.0–52.0)
MCV: 90.1 fL (ref 78.0–100.0)
RBC: 4.84 MIL/uL (ref 4.22–5.81)
RDW: 13.3 % (ref 11.5–15.5)
WBC: 12.4 10*3/uL — ABNORMAL HIGH (ref 4.0–10.5)

## 2012-08-01 LAB — POCT ACTIVATED CLOTTING TIME: Activated Clotting Time: 154 seconds

## 2012-08-01 LAB — CREATININE, SERUM: Creatinine, Ser: 0.98 mg/dL (ref 0.50–1.35)

## 2012-08-01 LAB — HEPARIN LEVEL (UNFRACTIONATED): Heparin Unfractionated: 0.3 IU/mL (ref 0.30–0.70)

## 2012-08-01 SURGERY — LEFT HEART CATHETERIZATION WITH CORONARY ANGIOGRAM
Anesthesia: LOCAL

## 2012-08-01 MED ORDER — ENOXAPARIN SODIUM 30 MG/0.3ML ~~LOC~~ SOLN
40.0000 mg | SUBCUTANEOUS | Status: DC
  Filled 2012-08-01 (×2): qty 0.4

## 2012-08-01 MED ORDER — NITROGLYCERIN 0.2 MG/ML ON CALL CATH LAB
INTRAVENOUS | Status: AC
  Filled 2012-08-01: qty 1

## 2012-08-01 MED ORDER — SODIUM CHLORIDE 0.9 % IR SOLN
80.0000 mg | Status: DC
  Filled 2012-08-01: qty 2

## 2012-08-01 MED ORDER — SODIUM CHLORIDE 0.9 % IV SOLN: 1.0000 mL/kg/h | INTRAVENOUS | Status: AC

## 2012-08-01 MED ORDER — ASPIRIN 81 MG PO CHEW
CHEWABLE_TABLET | ORAL | Status: AC
  Administered 2012-08-01: 324 mg
  Filled 2012-08-01: qty 4

## 2012-08-01 MED ORDER — LIDOCAINE HCL (PF) 1 % IJ SOLN
INTRAMUSCULAR | Status: AC
  Filled 2012-08-01: qty 30

## 2012-08-01 MED ORDER — CHLORHEXIDINE GLUCONATE 4 % EX LIQD
60.0000 mL | Freq: Once | CUTANEOUS | Status: AC
  Administered 2012-08-01: 4 via TOPICAL
  Filled 2012-08-01: qty 60

## 2012-08-01 MED ORDER — CHLORHEXIDINE GLUCONATE 4 % EX LIQD
60.0000 mL | Freq: Once | CUTANEOUS | Status: AC
  Administered 2012-08-02: 4 via TOPICAL
  Filled 2012-08-01: qty 60

## 2012-08-01 MED ORDER — HEPARIN (PORCINE) IN NACL 2-0.9 UNIT/ML-% IJ SOLN
INTRAMUSCULAR | Status: AC
  Filled 2012-08-01: qty 1000

## 2012-08-01 MED ORDER — CEFAZOLIN SODIUM-DEXTROSE 2-3 GM-% IV SOLR
2.0000 g | INTRAVENOUS | Status: DC
  Filled 2012-08-01: qty 50

## 2012-08-01 NOTE — CV Procedure (Signed)
Cardiac Catheterization Report   Tyler Cameron    72 y.o.  male  DOB: 10/07/1940  MRN: [id number]  08/01/2012   PROCEDURE:  Left heart catheterization with selective coronary angiography, left ventriculogram.  INDICATIONS: Out of hospital cardiac arrest, previous stenting, ischemic cardiomyopathy. The risks, benefits, and details of the procedure were explained to the patient.  The patient verbalized understanding and wanted to proceed.  Informed written consent was obtained.  PROCEDURE TECHNIQUE:  After Xylocaine anesthesia a 13F sheath was placed in the right femoral artery with a single anterior needle wall stick.   Left coronary angiography was done using a Judkins L4 guide catheter.  Right coronary angiography was done using a Judkins R4 guide catheter.  A 30 cc ventriculogram was performed in the 30 degree RAO projection.  Tolerated the procedure well.  Sheath removed in the holding area.   CONTRAST:  Total of 70 cc.  COMPLICATIONS:  None.    HEMODYNAMICS:  Aortic postcontrast 121/62, left ventricle postcontrast 121/16-23 There was no gradient between the left ventricle and aorta.    ANGIOGRAPHIC DATA:    CORONARY ARTERIES:   Arise and distribute normally.  Right dominant. Diffuse severe coronary calcification is noted.  Left main coronary artery: Calcified with mild irregularity  Left anterior descending: Calcified proximally. Stent is seen in the proximal vessel which is widely patent. There is mild/moderate diffuse irregularity but no severe obstructive stenoses are noted  Circumflex coronary artery: Calcified proximally. Stents in the midportion of vessel which is widely patent. Mild/moderate irregularity with no severe focal structural stenoses noted  Right coronary artery: Occluded proximally, the distal vessel is dominant and fills by left to right collaterals LEFT VENTRICULOGRAM:  Performed in the 30 RAO projection.  The aortic and  mitral valves are normal. The left ventricle is dilated with severe diffuse hypokinesis. The estimated ejection fraction is 20%. IMPRESSIONS:  1. Coronary artery disease with long-term patency of the stented site in the LAD and the circumflex with occlusion of the right coronary artery with collaterals 2. Dilated left ventricle with severe diffuse hypokinesis compatible with ischemic cardiomyopathy  RECOMMENDATION:  Implantation of a defibrillator, stop heparin   W. Viann Fish, Montez Hageman. MD [facility]

## 2012-08-01 NOTE — Care Management Note (Signed)
    Page 1 of 1   08/01/2012     12:18:54 PM   CARE MANAGEMENT NOTE 08/01/2012  Patient:  Tyler Cameron, Tyler Cameron   Account Number:  [id number]  Date Initiated:  08/01/2012  Documentation initiated by:  Tyler Cameron  Subjective/Objective Assessment:   adm w cardiac arrest     Action/Plan:   lives alone   Anticipated DC Date:     Anticipated DC Plan:        DC Planning Services  CM consult      Choice offered to / List presented to:             Status of service:   Medicare Important Message given?   (If response is "NO", the following Medicare IM given date fields will be blank) Date Medicare IM given:   Date Additional Medicare IM given:    Discharge Disposition:    Per UR Regulation:  Reviewed for med. necessity/level of care/duration of stay  If discussed at Long Length of Stay Meetings, dates discussed:    Comments:  08/01/12 1218 Tyler Eusebio Blazejewski rn,bsn

## 2012-08-01 NOTE — Progress Notes (Signed)
08/01/2012 0830 To Cath Lab  Tyler Cameron

## 2012-08-01 NOTE — Progress Notes (Signed)
Chaplain noticed that spiritual consult was still open for pt and wanted to follow up. Chaplain understood that Chaplain Addo had called Saturday, at pt's request, for [employer] priest to come prior to pt's procedure scheduled for Monday. Pt said that priest came, and pt seemed very pleased about that. Pt was sitting in chair and even stood up to shake hands with me. Pt seemed to be doing very well indeed.

## 2012-08-01 NOTE — H&P (View-Only) (Signed)
Subjective:  Not SOB, denies angina or ischemic pain.  C/o chest wall soreness  Objective:  Vital Signs in the last 24 hours: BP 115/74  Pulse 46  Temp 97.7 F (36.5 C) (Oral)  Resp 14  Ht 5' 11" (1.803 m)  Wt 92.987 kg (205 lb)  BMI 28.59 kg/m2  SpO2 97%  Physical Exam: Pleasant WM in NAD Ear:  Large hematoma right pinna, non tender Lungs:  Clear  Cardiac:  irregular rhythm, normal S1 and S2, no S3 Abdomen:  Soft, nontender, no masses Extremities:  No edema present  Intake/Output from previous day: 01/18 0701 - 01/19 0700 In: 1124 [P.O.:780; I.V.:344] Out: 1150 [Urine:1150]  Weight Filed Weights   07/30/12 1039  Weight: 92.987 kg (205 lb)    Lab Results: Basic Metabolic Panel:  Basename 07/30/12 1250 07/30/12 1021  NA 136 136  K 4.5 4.3  CL 98 98  CO2 27 25  GLUCOSE 204* 251*  BUN 18 19  CREATININE 1.17 1.23   CBC:  Basename 07/31/12 0515 07/30/12 1021  WBC 11.6* 10.3  NEUTROABS -- --  HGB 15.5 15.6  HCT 44.1 44.3  MCV 89.6 89.9  PLT 167 171   Cardiac Enzymes:  Basename 07/30/12 2340 07/30/12 1834 07/30/12 1251  CKTOTAL -- -- --  CKMB -- -- --  CKMBINDEX -- -- --  TROPONINI <0.30 <0.30 <0.30    Telemetry: Atrial fib with occasional PVC's - no VT  Assessment/Plan:  1. Cardiac Arrest witnessed  good recovery with no neuro deficits 2. Cardiomyopathy Ischemic  3. CAD,Native  4. Arrhythmia-Atrial Fibrillation 5. Ear hematoma -  Will ask ENT to see and may need to stop heparin  Rec:  Discussed with EP.  They will see tomorrow.  Plan cardiac cath to assess anatomy.  Cardiac catheterization was discussed with the patient fully including risks of myocardial infarction, death, stroke, bleeding, arrhythmia, dye allergy, renal insufficiency or bleeding.  The patient understands and is willing to proceed. Will need defibrillator post cath results know.  Check ECHO today.  Watch right ear.     W. Spencer Jessey Huyett, Jr.  MD  [facility] Cardiology  07/31/2012, 8:11 AM    

## 2012-08-01 NOTE — Consult Note (Signed)
ELECTROPHYSIOLOGY CONSULT NOTE    Patient ID: Tyler Cameron MRN: [id number], DOB/AGE: [birth year] 72 y.o.  Admit date: 07/30/2012 Date of Consult: 08-01-2012  Primary Physician: Erick Blinks Primary Cardiologist: Viann Fish, MD  Reason for Consultation: aborted SCA  HPI:  Mr. Tyler Cameron is a 72 year old male whom we have been asked to evaluate following aborted SCA.  His past medical history is significant for coronary artery disease s/p stenting, ischemic cardiomyopathy (EF 20's for >1 year), atrial fibrillation (not currently on anti-coagulation per patient refusal), and hyperlipidemia.  Dr Donnie Aho has had discussions with the patient as an outpatient regarding ICD implantation for primary prevention, but the patient had refused to date.  On Saturday, he walked into the aquatic center to see his granddaughter swim.  He climbed the steps to the grandstand, was looking down at the pool, developed dizziness and shortness of breath and had LOC.  Dr Rennis Golden with [facility] was present as well as several EMT's.  CPR was initiated and he was shocked twice with AED with ROSC.  He was transferred to [facility] for further evaluation.    Catheterization done today demonstrated patency of stent to LAD and the circumflex with occlusion of the right coronary artery with collaterals.  EF 20%.  Prior to admission, he reports that he lived independently.  Fairly sedentary lifestyle, but does walk on the treadmill and swims.  He denies chest pain or shortness of breath.  No LE edema, palpitations, dizziness, syncope, or pre-syncope.     Past Medical History  Diagnosis Date  . CAD (coronary artery disease) 07/30/2012    Inferior MI 2001 Stent to LAD and circ 2001 Refused cath 2008 in [place] Ischemic cardiomyopathy   . Ischemic cardiomyopathy 07/30/2012    Present since 2001 EKF 20% by ECHO 12/02/11   . Atrial fibrillation     Diagnosed 2008, Tried Pradaxa and Xarelto that caused hematuria Refuses warfarin   .  Hypertensive heart disease without CHF   . Left atrial thrombus     He developed atrial fibrillation 2008 and was found to have a mass in his atrium on a CT scan. Confirmed by TEE that also showed spontaneous contrast.  He was placed on Pradaxa and had 2 TEE's  that eventually showed some reduction in the size of his thrombus but he refused to have a third TEE. He was taken off of Pradaxa and later placed on Xarelto but developed hematuria on Xarelto and quit taking this. He refused to consider going on warfarin and was willing to take the risks of stroke being on just aspirin alone   . Hyperlipidemia   . Herpes simplex of eye      Surgical History:  Past Surgical History  Procedure Date  . Coronary angioplasty with stent placement   . Anal fissurectomy   . Corneal transplant   . Retinal detachment surgery      Prescriptions prior to admission  Medication Sig Dispense Refill  . aspirin EC 81 MG tablet Take 81 mg by mouth daily.      . digoxin (LANOXIN) 0.125 MG tablet Take 0.125 mg by mouth daily.      Marland Kitchen lisinopril (PRINIVIL,ZESTRIL) 10 MG tablet Take 10 mg by mouth daily.      . metoprolol tartrate (LOPRESSOR) 25 MG tablet Take 25 mg by mouth 2 (two) times daily.        Inpatient Medications:    . aspirin EC  81 mg Oral Daily  . bacitracin  1 application Topical BID  . enoxaparin  40 mg Subcutaneous Q24H  . metoprolol tartrate  50 mg Oral BID  . nicotine  14 mg Transdermal Daily  . sodium chloride  3 mL Intravenous Q12H    Allergies:  Allergies  Allergen Reactions  . Lopid (Gemfibrozil)     "Ate away my muscles"  . Pradaxa (Dabigatran Etexilate Mesylate) Other (See Comments)    unknown    History   Social History  . Marital Status: Legally Separated    Spouse Name: N/A    Number of Children: N/A  . Years of Education: N/A   Occupational History  . Not on file.   Social History Main Topics  . Smoking status: Current Some Day Smoker -- 1.0 packs/day for 50 years      Types: Cigarettes  . Smokeless tobacco: Not on file  . Alcohol Use: 1.5 oz/week    3 drink(s) per week  . Drug Use: No  . Sexually Active: Not on file   Other Topics Concern  . Not on file   Social History Narrative   Divorced, lives alone  Retired Surveyor, [employer].     History reviewed. No pertinent family history.   BP 101/67  Pulse 78  Temp 98.1 F (36.7 C) (Oral)  Resp 16  Ht 5\' 11"  (1.803 m)  Wt 205 lb (92.987 kg)  BMI 28.59 kg/m2  SpO2 94%  Alert and oriented in no acute distress HENT- normal; poor dentition Eyes- EOMI, without scleral icterus Skin- warm and dry; without rashes LN-neg Neck- supple without thyromegaly, JVP-flat, carotids brisk and full without bruits Back-without CVAT or kyphosis Lungs-clear to auscultation CV-Irregular rate and rhythm, nl S1 and S2, no murmurs gallops or rubs, S4-absent Abd-soft with active bowel sounds; no midline pulsation or hepatomegaly Pulses-intact femoral and distal MKS-without gross deformity Neuro- Ax O, CN3-12 intact, grossly normal motor and sensory function Affect engaging  Labs:   Lab Results  Component Value Date   WBC 12.4* 08/01/2012   HGB 15.4 08/01/2012   HCT 43.6 08/01/2012   MCV 90.1 08/01/2012   PLT 156 08/01/2012    Lab 07/30/12 1250  NA 136  K 4.5  CL 98  CO2 27  BUN 18  CREATININE 1.17  CALCIUM 9.0  PROT 6.3  BILITOT 0.8  ALKPHOS 61  ALT 21  AST 22  GLUCOSE 204*   Lab Results  Component Value Date   TROPONINI <0.30 07/30/2012   Lab Results  Component Value Date   CHOL 179 07/31/2012   Lab Results  Component Value Date   HDL 37* 07/31/2012   Lab Results  Component Value Date   LDLCALC 113* 07/31/2012   Lab Results  Component Value Date   TRIG 144 07/31/2012   Lab Results  Component Value Date   CHOLHDL 4.8 07/31/2012    Radiology/Studies: Dg Chest Portable 1 View 07/30/2012  *RADIOLOGY REPORT*  Clinical Data: Arrhythmia, post CPR  PORTABLE CHEST - 1 VIEW  Comparison:  11/23/2011  Findings:  Grossly unchanged cardiac silhouette and mediastinal contours with atherosclerotic calcifications within the thoracic aorta. Transcutaneous pacer devices overlie the left heart apex. No focal airspace opacities.  No evidence of pulmonary edema.  No pleural effusion or pneumothorax.  Unchanged bones.  IMPRESSION: No acute cardiopulmonary disease, specifically, no definite evidence of edema   Original Report Authenticated By: Tacey Ruiz, MD    ZOX:WRUE, v rates 90's  QRS d 118 TELEMETRY: afib with occasional PVC's  ECHO:  07-31-2012- EF 20-25%, LV dilated, diffuse hypokinesis, mild MR, LA 47  Of note, patient does not have a landline phone.    Patient Active Hospital Problem List: Cardiac arrest (07/30/2012)   Atrial fibrillation ()   CAD (coronary artery disease) (07/30/2012)   Ischemic cardiomyopathy (07/30/2012)   Hypertensive heart disease without CHF ()   Pt with aborted cardiac arrest and ICM/NICM and class II CHF.  Indicated for secondary ICD ; Have reviewed the potential benefits and risks of ICD implantation including but not limited to death, perforation of heart or lung, lead dislodgement, infection,  device malfunction and inappropriate shocks.  The patient and express understanding  and is willing to proceed.    We spoke at length about desires to prevent dying about which he has expressed some ambivalence.  I assured him the ICD could be inactivated if Quality of LIfe were to deteriorate

## 2012-08-01 NOTE — Consult Note (Signed)
ANTICOAGULATION CONSULT NOTE - F/U Consult  Pharmacy Consult for Heparin Indication: chest pain/ACS  Allergies  Allergen Reactions  . Lopid (Gemfibrozil)     "Ate away my muscles"  . Pradaxa (Dabigatran Etexilate Mesylate) Other (See Comments)    unknown    Patient Measurements: Height: 5\' 11"  (180.3 cm) Weight: 205 lb (92.987 kg) IBW/kg (Calculated) : 75.3  Heparin Dosing Weight: 92kg  Vital Signs: Temp: 97.7 F (36.5 C) (01/20 0749) Temp src: Oral (01/20 0749) BP: 135/71 mmHg (01/20 0749)  Labs:  Basename 08/01/12 0505 07/31/12 0515 07/30/12 2340 07/30/12 1933 07/30/12 1834 07/30/12 1251 07/30/12 1250 07/30/12 1021  HGB 15.4 15.5 -- -- -- -- -- --  HCT 43.6 44.1 -- -- -- -- -- 44.3  PLT 156 167 -- -- -- -- -- 171  APTT -- -- -- -- -- -- 161* --  LABPROT -- -- -- -- -- -- -- 15.1  INR -- -- -- -- -- -- -- 1.21  HEPARINUNFRC 0.30 0.41 -- 0.32 -- -- -- --  CREATININE -- -- -- -- -- -- 1.17 1.23  CKTOTAL -- -- -- -- -- -- -- --  CKMB -- -- -- -- -- -- -- --  TROPONINI -- -- <0.30 -- <0.30 <0.30 -- --    Estimated Creatinine Clearance: 65.5 ml/min (by C-G formula based on Cr of 1.17).   Medications:  No anticoagulants pta  Assessment: 71yom with hx CAD s/p 4 stents and afib is now s/p cardiac arrest with return of pulses after 2 rounds of CPR and 2 shocks. Heparin level remains therapeutic (HL=0.3). Patient noted with R ear ecchymosis and ok to continue heparin per MD. Patient noted for cath today.  Goal of Therapy:  Heparin level 0.3-0.7 units/ml Monitor platelets by anticoagulation protocol: Yes   Plan:  -Continue heparin 1200 units/hr -Will follow post cath  Harland German, Pharm D 08/01/2012 8:43 AM

## 2012-08-01 NOTE — Interval H&P Note (Signed)
History and Physical Interval Note:  08/01/2012 8:44 AM    Patient seen and examined.  No interval change in history and exam since last note.  Stable for procedure.  Darden Palmer. MD [facility]  08/01/2012       Donnie Aho JR,W SPENCER

## 2012-08-01 NOTE — H&P (Deleted)
    Patient seen and examined.  No interval change in history and exam since last note yesterday.  Stable for procedure.  Darden Palmer. MD [facility]  08/01/2012  8:37 am

## 2012-08-02 ENCOUNTER — Encounter (HOSPITAL_COMMUNITY)
Admission: EM | Disposition: A | Discharge status: Home / Self Care | Payer: Self-pay | Source: Home / Self Care | Attending: Cardiology

## 2012-08-02 DIAGNOSIS — I2589 Other forms of chronic ischemic heart disease: Secondary | ICD-10-CM

## 2012-08-02 HISTORY — PX: CARDIAC DEFIBRILLATOR PLACEMENT: SHX171

## 2012-08-02 HISTORY — PX: IMPLANTABLE CARDIOVERTER DEFIBRILLATOR IMPLANT: SHX5473

## 2012-08-02 LAB — GLUCOSE, CAPILLARY

## 2012-08-02 SURGERY — IMPLANTABLE CARDIOVERTER DEFIBRILLATOR IMPLANT
Anesthesia: LOCAL

## 2012-08-02 MED ORDER — MIDAZOLAM HCL 5 MG/5ML IJ SOLN
INTRAMUSCULAR | Status: AC
  Filled 2012-08-02: qty 5

## 2012-08-02 MED ORDER — METOPROLOL TARTRATE 25 MG PO TABS
25.0000 mg | ORAL_TABLET | Freq: Two times a day (BID) | ORAL | Status: DC
  Administered 2012-08-02 (×2): 25 mg via ORAL
  Filled 2012-08-02 (×4): qty 1

## 2012-08-02 MED ORDER — ASPIRIN EC 81 MG PO TBEC
81.0000 mg | DELAYED_RELEASE_TABLET | Freq: Every day | ORAL | Status: DC
  Administered 2012-08-02 – 2012-08-03 (×2): 81 mg via ORAL
  Filled 2012-08-02 (×2): qty 1

## 2012-08-02 MED ORDER — DIGOXIN 125 MCG PO TABS
0.1250 mg | ORAL_TABLET | Freq: Every day | ORAL | Status: DC
  Administered 2012-08-02 – 2012-08-03 (×2): 0.125 mg via ORAL
  Filled 2012-08-02 (×2): qty 1

## 2012-08-02 MED ORDER — FENTANYL CITRATE 0.05 MG/ML IJ SOLN
INTRAMUSCULAR | Status: AC
  Filled 2012-08-02: qty 2

## 2012-08-02 MED ORDER — LISINOPRIL 10 MG PO TABS
10.0000 mg | ORAL_TABLET | Freq: Every day | ORAL | Status: DC
  Administered 2012-08-02 – 2012-08-03 (×2): 10 mg via ORAL
  Filled 2012-08-02 (×2): qty 1

## 2012-08-02 MED ORDER — CEFAZOLIN SODIUM 1-5 GM-% IV SOLN
1.0000 g | Freq: Four times a day (QID) | INTRAVENOUS | Status: AC
  Administered 2012-08-02 – 2012-08-03 (×3): 1 g via INTRAVENOUS
  Filled 2012-08-02 (×5): qty 50

## 2012-08-02 MED ORDER — LIDOCAINE HCL (PF) 1 % IJ SOLN
INTRAMUSCULAR | Status: AC
  Filled 2012-08-02: qty 60

## 2012-08-02 MED ORDER — INSULIN ASPART 100 UNIT/ML ~~LOC~~ SOLN: 0.0000 [IU] | Freq: Three times a day (TID) | SUBCUTANEOUS | Status: DC

## 2012-08-02 MED ORDER — SODIUM CHLORIDE 0.9 % IV SOLN: INTRAVENOUS | Status: AC

## 2012-08-02 MED ORDER — ONDANSETRON HCL 4 MG/2ML IJ SOLN: 4.0000 mg | Freq: Four times a day (QID) | INTRAMUSCULAR | Status: DC | PRN

## 2012-08-02 MED ORDER — SODIUM CHLORIDE 0.9 % IV SOLN
INTRAVENOUS | Status: DC
  Administered 2012-08-02: 08:00:00 via INTRAVENOUS

## 2012-08-02 MED ORDER — ACETAMINOPHEN 325 MG PO TABS: 325.0000 mg | ORAL_TABLET | ORAL | Status: DC | PRN

## 2012-08-02 NOTE — H&P (Signed)
See consult note dated today. 

## 2012-08-02 NOTE — Interval H&P Note (Signed)
History and Physical Interval Note:  08/02/2012 8:04 AM  Tyler Cameron  has presented today for surgery, with the diagnosis of a  The various methods of treatment have been discussed with the patient and family. After consideration of risks, benefits and other options for treatment, the patient has consented to  Procedure(s) (LRB) with comments: IMPLANTABLE CARDIOVERTER DEFIBRILLATOR IMPLANT (N/A) as a surgical intervention .  The patient's history has been reviewed, patient examined, no change in status, stable for surgery.  I have reviewed the patient's chart and labs.  Questions were answered to the patient's satisfaction.     Sherryl Manges

## 2012-08-02 NOTE — CV Procedure (Signed)
Tyler Cameron [id number]  [id number]  Preop Dx: cardiac arrest Postop Dx same/   Procedure: icd implant  Cx: None   Dictation number [id number]  Sherryl Manges, MD 08/02/2012 11:48 AM

## 2012-08-02 NOTE — Progress Notes (Signed)
Subjective:  Not SOB, denies angina.  Cath site fine.  Chest wall not sore.  Objective:  Vital Signs in the last 24 hours: BP 128/81  Pulse 78  Temp 97.6 F (36.4 C) (Oral)  Resp 16  Ht 5\' 11"  (1.803 m)  Wt 92.987 kg (205 lb)  BMI 28.59 kg/m2  SpO2 94%  Physical Exam: Pleasant WM in NAD Ear:  Large hematoma right pinna, non tender Lungs:  Clear  Cardiac:  irregular rhythm, normal S1 and S2, no S3 Abdomen:  Soft, nontender, no masses Extremities:  No edema present  Intake/Output from previous day: 01/20 0701 - 01/21 0700 In: 893 [P.O.:600; I.V.:293] Out: 1450 [Urine:1450]  Weight Filed Weights   07/30/12 1039  Weight: 92.987 kg (205 lb)    Lab Results: Basic Metabolic Panel:  Basename 08/01/12 1111 07/30/12 1250 07/30/12 1021  NA -- 136 136  K -- 4.5 4.3  CL -- 98 98  CO2 -- 27 25  GLUCOSE -- 204* 251*  BUN -- 18 19  CREATININE 0.98 1.17 --   CBC:  Basename 08/01/12 0505 07/31/12 0515  WBC 12.4* 11.6*  NEUTROABS -- --  HGB 15.4 15.5  HCT 43.6 44.1  MCV 90.1 89.6  PLT 156 167   Cardiac Enzymes:  Basename 07/30/12 2340 07/30/12 1834 07/30/12 1251  CKTOTAL -- -- --  CKMB -- -- --  CKMBINDEX -- -- --  TROPONINI <0.30 <0.30 <0.30    Telemetry: Atrial fib with occasional PVC's - no VT  Assessment/Plan:  1. Cardiac Arrest witnessed  good recovery with no neuro deficits 2. Cardiomyopathy Ischemic  3. CAD,Native  4. Arrhythmia-Atrial Fibrillation 5. Ear hematoma -  Will ask ENT to see and may need to stop heparin  Rec:  Ear is better.  Not glucoses up.  For defibrillator today.  Plan check HbA12.     W. Ashley Royalty  MD [facility] Card[facility]  08/02/2012, 8:38 AM

## 2012-08-03 ENCOUNTER — Inpatient Hospital Stay (HOSPITAL_COMMUNITY): Payer: Medicare Other

## 2012-08-03 ENCOUNTER — Encounter (HOSPITAL_COMMUNITY): Payer: Self-pay | Admitting: Cardiology

## 2012-08-03 DIAGNOSIS — Z95810 Presence of automatic (implantable) cardiac defibrillator: Secondary | ICD-10-CM | POA: Diagnosis not present

## 2012-08-03 DIAGNOSIS — E118 Type 2 diabetes mellitus with unspecified complications: Secondary | ICD-10-CM | POA: Insufficient documentation

## 2012-08-03 LAB — GLUCOSE, CAPILLARY: Glucose-Capillary: 136 mg/dL — ABNORMAL HIGH (ref 70–99)

## 2012-08-03 MED ORDER — METOPROLOL TARTRATE 50 MG PO TABS: 50.0000 mg | ORAL_TABLET | Freq: Two times a day (BID) | ORAL | Status: DC

## 2012-08-03 MED ORDER — METOPROLOL TARTRATE 50 MG PO TABS
50.0000 mg | ORAL_TABLET | Freq: Two times a day (BID) | ORAL | Status: DC
  Administered 2012-08-03: 50 mg via ORAL
  Filled 2012-08-03 (×2): qty 1

## 2012-08-03 MED ORDER — NITROGLYCERIN 0.4 MG SL SUBL: 0.4000 mg | SUBLINGUAL_TABLET | SUBLINGUAL | Status: AC | PRN

## 2012-08-03 NOTE — Progress Notes (Signed)
     Patient: Tyler Cameron Date of Encounter: 08/03/2012, 8:51 AM Admit date: 07/30/2012     Subjective  Tyler Cameron is s/p ICD implant for secondary prevention of SCD. He is feeling well this AM. He has mild incisional soreness. He denies CP, SOB or palpitations.    Objective  Physical Exam: Vitals: BP 130/74  Pulse 96  Temp 97.8 F (36.6 C) (Oral)  Resp 18  Ht 5\' 11"  (1.803 m)  Wt 205 lb (92.987 kg)  BMI 28.59 kg/m2  SpO2 96% General: Well developed, well appearing 72 year old male in no acute distress. Neck: Supple. JVD not elevated. Lungs: Clear bilaterally to auscultation without wheezes, rales, or rhonchi. Breathing is unlabored. Heart: Irregularly irregular S1 S2 without murmurs, rubs, or gallops.  Abdomen: Soft, non-distended. Extremities: No clubbing or cyanosis. No edema.  Distal pedal pulses are 2+ and equal bilaterally. Neuro: Alert and oriented X 3. Moves all extremities spontaneously. No focal deficits. Skin: Left upper chest/implant site intact without significant bleeding or hematoma.  Intake/Output:  Intake/Output Summary (Last 24 hours) at 08/03/12 0851 Last data filed at 08/03/12 0600  Gross per 24 hour  Intake      0 ml  Output   1000 ml  Net  -1000 ml    Inpatient Medications:     . aspirin EC  81 mg Oral Daily  . bacitracin  1 application Topical BID  . digoxin  0.125 mg Oral Daily  . insulin aspart  0-15 Units Subcutaneous TID WC  . lisinopril  10 mg Oral Daily  . metoprolol tartrate  50 mg Oral BID  . nicotine  14 mg Transdermal Daily  . sodium chloride  3 mL Intravenous Q12H      . sodium chloride 50 mL/hr at 08/02/12 1610    Labs:  [facility] 08/01/12 1111  NA --  K --  CL --  CO2 --  GLUCOSE --  BUN --  CREATININE 0.98  CALCIUM --  MG --  PHOS --    Basename 08/01/12 0505  WBC 12.4*  NEUTROABS --  HGB 15.4  HCT 43.6  MCV 90.1  PLT 156    Radiology/Studies: Dg Chest 2 View  08/03/2012  *RADIOLOGY REPORT*  Clinical  Data: Post AICD.  CHEST - 2 VIEW  Comparison: 07/30/2012  Findings: Left AICD in place with single lead tip in the right ventricle.  No pneumothorax.  Mild cardiomegaly.  Lungs are clear. No effusions or acute bony abnormality.  IMPRESSION: Left AICD placement without pneumothorax.  No acute cardiopulmonary disease.   Original Report Authenticated By: Charlett Nose, M.D.     Telemetry: normal sinus rhythm with occasional PVCs Device interrogation: performed by industry this AM shows normal single chamber ICD function with stable lead measurements; no programming changes made    Assessment and Plan  1. s/p VF arrest, now s/p single chamber ICD implantation for secondary prevention of SCD Implant site intact and without bleeding or hematoma Chest x-ray shows stable lead placement without PTX Device interrogation shows normal ICD function Discussed follow-up with Tyler Cameron, his primary cardiologist, and we will see for wound check in 10 days then follow-up with Tyler Cameron in 3 months No driving for at least 6 months   Signed, Tyler Montagna PA-C

## 2012-08-03 NOTE — Progress Notes (Signed)
Inpatient Diabetes Program Recommendations  AACE/ADA: New Consensus Statement on Inpatient Glycemic Control (2013)  Target Ranges:  Prepandial:   less than 140 mg/dL      Peak postprandial:   less than 180 mg/dL (1-2 hours)      Critically ill patients:  140 - 180 mg/dL   Reason for Visit: Received referral for this patient.  Patient diagnosed with diabetes this admission per Dr. Donnie Aho.  Spoke with patient about his new diagnosis.  Patient somewhat resistant to the information I was providing him.  Explained that Dr. Donnie Aho diagnosed him with diabetes based on his A1c results of 7.6%.  Explained what an A1c is and what it measures.  Also attempted to explain to patient basic diabetes home care and basic diabetes pathophysiology.  Encouraged patient to follow-up with his PCP Dr. Mila Palmer and let his PCP know that he was diagnosed with diabetes in the hospital.  Encouraged patient to purchase a CBG meter and to begin checking his CBGs at home at least once daily.  [name] only question for me was "When are my feet gonna fall off?".  I explained to patient that if he is able to control his CBGs with diet, exercise, and possibly medication that he will reduce the chances of developing chronic complications.  Asked patient if he would like to follow-up with a dietitian/CDE at the [facility] for further diabetes education.  Patient told me he did not want to go at this time.  Advised patient that if he changes his mind and would like to go that he can get a referral from his PCP.   Note: Will follow. Ambrose Finland RN, MSN, CDE Diabetes Coordinator Inpatient Diabetes Program [phone]

## 2012-08-03 NOTE — Progress Notes (Signed)
Pt discharged to home per MD order. Pt received and reviewed all discharge instructions and medication information including follow-up appointments and prescriptions.   Pt also reviewed activity restrictions post-pacemaker insertion. Pt verbalized understanding. Pt also met with diabetes coordinator prior to discharge. Pt alert and oriented at discharge with no complaints of pain. Pt escorted to private vehicle via wheelchair by guest services. Efraim Kaufmann

## 2012-08-03 NOTE — Discharge Summary (Signed)
Physician Discharge Summary  Patient ID: Tyler Cameron MRN: [id number] DOB/AGE: [id number] 74 y.o.  Admit date: 07/30/2012 Discharge date: 08/03/2012  Primary Physician:  Dr. Mila Palmer  Primary Discharge Diagnosis: 1. Out of hospital cardiac arrest due to ventricular fibrillation-successfully resuscitated  Secondary Discharge Diagnosis: 2. Ischemic cardiomyopathy with ejection fraction of 20% 3. Coronary artery disease with long-term patency of the stented sites to the LAD and circumflex and total occlusion of the right coronary artery will 4. Newly diagnosed diabetes mellitus 5. Chronic atrial fibrillation with refusal to take warfarin therapy 6. Hyperlipidemia 7. Hypertensive heart disease  Procedures: 2-D echocardiogram Left heart catheterization Implantation of a single lead defibrillator by Dr. Graciela Husbands  Consults:  Eleanor Slater Hospital Course: The patient is a 72 year old male who has a known ischemic cardiomyopathy, the details of which are described in his history of present illness. He has previous been offered but refused a defibrillator. He also has chronic atrial fibrillation and has a known left atrial thrombus but is also refused warfarin anticoagulation. He had a witnessed out-of-hospital cardiac arrest at the [facility] and was successfully resuscitated with 2 shocks with bystander CPR with a physician present and brought to the emergency room. He was alert and oriented on admission with no neurologic deficits.  Serial cardiac enzymes did not show any elevation of troponin. He was moved to the coronary care unit. He had a large hematoma involving the pinna of his ear and was seen by Dr. Lazarus Salines who just recommended observation. He was placed on intravenous heparin overnight. He had atrial fibrillation and occasional PVCs noted. He was taken to the cardiac catheterization laboratory on 1/ 20 with findings of a widely patent stent to the LAD and circumflex  and an occluded right coronary artery. His ejection fraction was 20%. Dr. Sherryl Manges saw him in consultation felt that he would need to have a defibrillator implanted.  He underwent implantation of a single lead defibrillator by Dr. Graciela Husbands on 1/21 (RV lead St. Jude 7122 Q  serial #[id number].  Generator  St. Jude CD  [id number] Q , serial [id number]) his defibrillator site was checked the next morning and was clean and dry without hematoma or other issues. He also had a hemoglobin A1c of 7.6 and was seen by the diabetic educator and is to have outpatient diabetic education followup with his primary physician. His atrial fibrillation rate was slightly increased and his metoprolol was increased prior to discharge.  He was given full education regarding the defibrillator as well as the fact that he should prescriptions driving for 6 months Dr. Graciela Husbands. He continues to refuse warfarin therapy for atrial fibrillation. Ulcer does not wish to take statin therapy for his coronary artery disease.  Discharge Exam: Blood pressure 130/74, pulse 96, temperature 97.8 F (36.6 C), temperature source Oral, resp. rate 18, height 5\' 11"  (1.803 m), weight 92.987 kg (205 lb), SpO2 96.00%.   Lungs clear, irregular rhythm, no S3, defibrillator site is clean and dry  Labs: CBC:   Lab Results  Component Value Date   WBC 12.4* 08/01/2012   HGB 15.4 08/01/2012   HCT 43.6 08/01/2012   MCV 90.1 08/01/2012   PLT 156 08/01/2012   CMP:  Lab 08/01/12 1111 07/30/12 1250  NA -- 136  K -- 4.5  CL -- 98  CO2 -- 27  BUN -- 18  CREATININE 0.98 --  CALCIUM -- 9.0  PROT -- 6.3  BILITOT -- 0.8  ALKPHOS -- 61  ALT -- 21  AST -- 22  GLUCOSE -- 204*   Lipid Panel     Component Value Date/Time   CHOL 179 07/31/2012 0515   TRIG 144 07/31/2012 0515   HDL 37* 07/31/2012 0515   CHOLHDL 4.8 07/31/2012 0515   VLDL 29 07/31/2012 0515   LDLCALC 113* 07/31/2012 0515    Radiology: Cardiomegaly, no acute ST changes  EKG: Atrial  fibrillation with controlled response, IV conduction delay, nonspecific ST changes  Discharge Medications:  Riyad, Keena  Home Medication Instructions ZOX:[id number]   Printed on:08/03/12 0849  Medication Information                    metoprolol tartrate (LOPRESSOR) 50 MG tablet Take 50 mg by mouth 2 (two) times daily.           lisinopril (PRINIVIL,ZESTRIL) 10 MG tablet Take 10 mg by mouth daily.           digoxin (LANOXIN) 0.125 MG tablet Take 0.125 mg by mouth daily.           aspirin EC 81 MG tablet Take 81 mg by mouth daily.             Followup plans and appointments: He is to see Dr. Graciela Husbands for a wound check. He is to see me in followup in 2 weeks. He is instructed to call Dr. Paulino Rily to get an appointment to follow up his diabetes. He was given driving restrictions by Dr. Graciela Husbands for 6 months due to his out of hospital cardiac arrest. Also instructions were given to the care of the defibrillator site.  Time spent with patient to include physician time: 30 minutes  Signed: W. Ashley Royalty. MD [facility] 08/03/2012, 8:49 AM

## 2012-08-03 NOTE — Op Note (Signed)
Tyler, Cameron NO.:  [id number]  MEDICAL RECORD NO.:  [id number]  LOCATION:  3W01C                        FACILITY:  [facility]  PHYSICIAN:  Duke Salvia, MD, FACCDATE OF BIRTH:  [birth year]  DATE OF PROCEDURE:  08/02/2012 DATE OF DISCHARGE:                              OPERATIVE REPORT   PREOPERATIVE DIAGNOSIS:  Ischemic cardiomyopathy, aborted cardiac arrest, permanent atrial fibrillation.  POSTOPERATIVE DIAGNOSIS:  Ischemic cardiomyopathy, aborted cardiac arrest, permanent atrial fibrillation.  PROCEDURE:  Dual-chamber defibrillator implantation with plugging of the atrial port because of concerns about the potential need for AV junction ablation and an LV port.  AV delays on this device are minimal at 25 milliseconds.  Following obtaining informed consent, the patient was brought to electrophysiology laboratory and placed on the fluoroscopic table in supine position.  After routine prep and drape of the left upper chest, lidocaine was infiltrated in prepectoral subclavicular region.  Incision was made and carried down to layer of the prepectoral fascia using electrocautery and sharp dissection.  A pocket was formed similarly. Hemostasis was obtained.  Thereafter, attention was turned to gain access to the extrathoracic left subclavian vein which was accomplished without difficulty.  A guidewire was placed and retained.  A 8-French sheath was placed and through this was passed a St. Jude 7122 Q 55-cm length defibrillator lead, serial #[id number] [id number].  Under fluoroscopic guidance, with manipulated of the right ventricular apex with bipolar R-wave was 16.5 with a pace impedance of 605 and threshold 4.5.  Current threshold 0.8 mA.  There is no diaphragmatic pacing at 10 V.  The current of injury was modest.  The lead was secured to the prepectoral fascia and attached to a St. Jude CD [id number] defibrillator, serial [id number].  Through the device, bipolar  R- wave was 12, greater than 12 with a pace impedance of 600, threshold 0.5 at 0.5.  High-voltage impedance was 59 ohms.  The pocket was copiously irrigated with antibiotic containing saline solution.  Hemostasis was assured.  Leads and pulse generator placed in the pocket, secured to the prepectoral fascia.  The wound was then closed in 2 layers in normal fashion.  The wound was washed, dried, benzoin, Steri-Strip was applied. At this point, I scrubbed out to undertake defibrillation threshold testing.  Ventricular fibrillation was induced via T-wave shock.  After a total duration of 8 seconds, a 15-joule shock was delivered through resistance, which I will give you in just a second terminating ventricular fibrillation and restoring atrial fibrillation.  The high- voltage impedance 65 milliseconds.  The patient tolerated the procedure without apparent complication.  Needle counts, sponge counts, and instrument counts were correct at the end of the procedure according to staff.  The patient tolerated as noted.    Duke Salvia, MD, [facility]    SCK/MEDQ  D:  08/02/2012  T:  08/03/2012  Job:  [id number]

## 2012-08-11 ENCOUNTER — Ambulatory Visit (INDEPENDENT_AMBULATORY_CARE_PROVIDER_SITE_OTHER): Payer: Medicare Other | Admitting: *Deleted

## 2012-08-11 DIAGNOSIS — I469 Cardiac arrest, cause unspecified: Secondary | ICD-10-CM

## 2012-08-11 LAB — ICD DEVICE OBSERVATION
ATRIAL PACING ICD: 0 pct
BRDY-0002RV: 40 {beats}/min
DEVICE MODEL ICD: 7071894
HV IMPEDENCE: 65 Ohm
MODE SWITCH EPISODES: 0
PACEART VT: 0
RV LEAD AMPLITUDE: 11.7 mv
TOT-0007: 1
TZAT-0001SLOWVT: 1
TZAT-0004SLOWVT: 8
TZAT-0012SLOWVT: 200 ms
TZAT-0019SLOWVT: 7.5 V
TZON-0003SLOWVT: 300 ms
TZST-0001SLOWVT: 3
TZST-0001SLOWVT: 5
TZST-0003SLOWVT: 15 J
TZST-0003SLOWVT: 36 J

## 2012-08-11 NOTE — Progress Notes (Signed)
Wound check-ICD 

## 2012-09-08 ENCOUNTER — Encounter: Payer: Self-pay | Admitting: Internal Medicine

## 2012-11-03 ENCOUNTER — Encounter: Payer: Self-pay | Admitting: Internal Medicine

## 2012-11-03 ENCOUNTER — Ambulatory Visit (INDEPENDENT_AMBULATORY_CARE_PROVIDER_SITE_OTHER): Payer: Medicare Other | Admitting: Internal Medicine

## 2012-11-03 VITALS — BP 149/116 | HR 91 | Ht 71.0 in | Wt 199.0 lb

## 2012-11-03 DIAGNOSIS — I4891 Unspecified atrial fibrillation: Secondary | ICD-10-CM

## 2012-11-03 DIAGNOSIS — Z95810 Presence of automatic (implantable) cardiac defibrillator: Secondary | ICD-10-CM

## 2012-11-03 DIAGNOSIS — I469 Cardiac arrest, cause unspecified: Secondary | ICD-10-CM

## 2012-11-03 LAB — ICD DEVICE OBSERVATION
CHARGE TIME: 7.3 s
HV IMPEDENCE: 70 Ohm
RV LEAD AMPLITUDE: 11.7 mv
RV LEAD THRESHOLD: 0.5 V
TOT-0008: 0
TOT-0009: 1
TZAT-0001SLOWVT: 1
TZAT-0013SLOWVT: 3
TZAT-0019SLOWVT: 7.5 V
TZAT-0020SLOWVT: 1 ms
TZON-0003SLOWVT: 300 ms
TZON-0004SLOWVT: 35
TZON-0005SLOWVT: 6
TZST-0001SLOWVT: 2
TZST-0001SLOWVT: 4
TZST-0003SLOWVT: 30 J
TZST-0003SLOWVT: 36 J
VF: 0

## 2012-11-03 NOTE — Progress Notes (Signed)
Patient Care Team: Emeterio Reeve, MD as PCP - General (Family Medicine) Othella Boyer, MD as Consulting Physician (Cardiology)   HPI  Tyler Cameron is a 72 y.o. male Seen in followup for aborted cardiac arrest. He is a history of ischemic heart disease and prior stenting. He had declined ICD implantation prior to his cardiac arrest January 2014.  The patient denies chest pain, shortness of breath, nocturnal dyspnea, orthopnea or peripheral edema.  There have been no palpitations, lightheadedness or syncope.    Past Medical History  Diagnosis Date  . CAD (coronary artery disease) 07/30/2012    Inferior MI 2001 Stent to LAD and circ 2001 Refused cath 2008 in [place] Ischemic cardiomyopathy   . Ischemic cardiomyopathy 07/30/2012    Present since 2001 EKF 20% by ECHO 12/02/11   . Atrial fibrillation     Diagnosed 2008, Tried Pradaxa and Xarelto that caused hematuria Refuses warfarin   . Hypertensive heart disease without CHF   . Left atrial thrombus     He developed atrial fibrillation 2008 and was found to have a mass in his atrium on a CT scan. Confirmed by TEE that also showed spontaneous contrast.  He was placed on Pradaxa and had 2 TEE's  that eventually showed some reduction in the size of his thrombus but he refused to have a third TEE. He was taken off of Pradaxa and later placed on Xarelto but developed hematuria on Xarelto and quit taking this. He refused to consider going on warfarin and was willing to take the risks of stroke being on just aspirin alone   . Hyperlipidemia   . Herpes simplex of eye   . AICD (automatic cardioverter/defibrillator) present     Implant 08/02/12 Dr. Graciela Husbands RV lead St. Jude 7122 Q  serial #BPA [id number].  Generator  St. Jude CD  [phone] Q , serial [id number]   . Diabetes mellitus type 2, noninsulin dependent     Past Surgical History  Procedure Laterality Date  . Coronary angioplasty with stent placement  2001  . Anal fissurectomy    . Corneal  transplant    . Retinal detachment surgery    . Cardiac defibrillator placement  08/02/12    St. Jude device    Current Outpatient Prescriptions  Medication Sig Dispense Refill  . aspirin EC 81 MG tablet Take 81 mg by mouth daily.      . digoxin (LANOXIN) 0.125 MG tablet Take 0.125 mg by mouth daily.      Marland Kitchen lisinopril (PRINIVIL,ZESTRIL) 10 MG tablet Take 10 mg by mouth daily.      . metoprolol (LOPRESSOR) 50 MG tablet Take 1 tablet (50 mg total) by mouth 2 (two) times daily.  60 tablet  12  . nitroGLYCERIN (NITROSTAT) 0.4 MG SL tablet Place 1 tablet (0.4 mg total) under the tongue every 5 (five) minutes x 3 doses as needed for chest pain.  25 tablet  12   No current facility-administered medications for this visit.    Allergies  Allergen Reactions  . Lopid (Gemfibrozil)     "Ate away my muscles"  . Pradaxa (Dabigatran Etexilate Mesylate) Other (See Comments)    unknown  . Xarelto (Rivaroxaban)     Review of Systems negative except from HPI and PMH  Physical Exam BP 149/116  Pulse 91  Ht 5\' 11"  (1.803 m)  Wt 199 lb (90.266 kg)  BMI 27.77 kg/m2  SpO2 99% Well developed and well nourished in no acute distress HENT normal  E scleral and icterus clear Neck Supple JVP flat; carotids brisk and full Clear to ausculation  Regular rate and rhythm, no murmurs gallops or rub Soft with active bowel sounds No clubbing cyanosis none Edema Alert and oriented, grossly normal motor and sensory function Skin Warm and Dry    Assessment and  Plan

## 2012-11-03 NOTE — Assessment & Plan Note (Signed)
The patient's device was interrogated and the information was fully reviewed.  The device was reprogrammed to  Decrease outputs  Instructions re skock reviewed

## 2012-11-03 NOTE — Patient Instructions (Signed)
Remote monitoring is used to monitor your Pacemaker of ICD from home. This monitoring reduces the number of office visits required to check your device to one time per year. It allows [place] to keep an eye on the functioning of your device to ensure it is working properly. You are scheduled for a device check from home on 02/06/13. You may send your transmission at any time that day. If you have a wireless device, the transmission will be sent automatically. After your physician reviews your transmission, you will receive a postcard with your next transmission date.  Your physician wants you to follow-up in: January 2015 with Dr. Graciela Husbands. You will receive a reminder letter in the mail two months in advance. If you don't receive a letter, please call our office to schedule the follow-up appointment.  Your physician recommends that you continue on your current medications as directed. Please refer to the Current Medication list given to you today.

## 2012-11-03 NOTE — Assessment & Plan Note (Signed)
No intercurrent Ventricular tachycardia  

## 2012-11-10 DIAGNOSIS — R300 Dysuria: Secondary | ICD-10-CM | POA: Diagnosis not present

## 2012-11-10 DIAGNOSIS — R0602 Shortness of breath: Secondary | ICD-10-CM | POA: Diagnosis not present

## 2012-11-10 DIAGNOSIS — I4891 Unspecified atrial fibrillation: Secondary | ICD-10-CM | POA: Diagnosis not present

## 2012-11-10 DIAGNOSIS — F17200 Nicotine dependence, unspecified, uncomplicated: Secondary | ICD-10-CM | POA: Diagnosis not present

## 2012-11-10 DIAGNOSIS — Z8679 Personal history of other diseases of the circulatory system: Secondary | ICD-10-CM | POA: Diagnosis not present

## 2012-11-11 ENCOUNTER — Ambulatory Visit
Admission: RE | Admit: 2012-11-11 | Discharge: 2012-11-11 | Disposition: A | Discharge status: Home / Self Care | Payer: Medicare Other | Source: Ambulatory Visit | Attending: Cardiology | Admitting: Cardiology

## 2012-11-11 ENCOUNTER — Other Ambulatory Visit: Payer: Self-pay | Admitting: Cardiology

## 2012-11-11 DIAGNOSIS — R0989 Other specified symptoms and signs involving the circulatory and respiratory systems: Secondary | ICD-10-CM

## 2012-11-11 DIAGNOSIS — I4891 Unspecified atrial fibrillation: Secondary | ICD-10-CM | POA: Diagnosis not present

## 2012-11-11 DIAGNOSIS — I2510 Atherosclerotic heart disease of native coronary artery without angina pectoris: Secondary | ICD-10-CM | POA: Diagnosis not present

## 2012-11-11 DIAGNOSIS — I2589 Other forms of chronic ischemic heart disease: Secondary | ICD-10-CM | POA: Diagnosis not present

## 2012-11-11 DIAGNOSIS — Z95810 Presence of automatic (implantable) cardiac defibrillator: Secondary | ICD-10-CM | POA: Diagnosis not present

## 2012-11-11 DIAGNOSIS — R0609 Other forms of dyspnea: Secondary | ICD-10-CM

## 2012-11-11 DIAGNOSIS — E785 Hyperlipidemia, unspecified: Secondary | ICD-10-CM | POA: Diagnosis not present

## 2012-11-11 DIAGNOSIS — J90 Pleural effusion, not elsewhere classified: Secondary | ICD-10-CM | POA: Diagnosis not present

## 2012-11-11 DIAGNOSIS — I5022 Chronic systolic (congestive) heart failure: Secondary | ICD-10-CM | POA: Diagnosis not present

## 2012-11-11 DIAGNOSIS — Z8674 Personal history of sudden cardiac arrest: Secondary | ICD-10-CM | POA: Diagnosis not present

## 2012-11-11 DIAGNOSIS — J9819 Other pulmonary collapse: Secondary | ICD-10-CM | POA: Diagnosis not present

## 2012-11-17 DIAGNOSIS — R17 Unspecified jaundice: Secondary | ICD-10-CM | POA: Diagnosis not present

## 2012-11-17 DIAGNOSIS — R7309 Other abnormal glucose: Secondary | ICD-10-CM | POA: Diagnosis not present

## 2012-11-17 DIAGNOSIS — R0602 Shortness of breath: Secondary | ICD-10-CM | POA: Diagnosis not present

## 2012-11-22 ENCOUNTER — Other Ambulatory Visit: Payer: Self-pay | Admitting: Cardiology

## 2012-11-22 ENCOUNTER — Other Ambulatory Visit: Payer: Medicare Other

## 2012-11-22 DIAGNOSIS — I2589 Other forms of chronic ischemic heart disease: Secondary | ICD-10-CM | POA: Diagnosis not present

## 2012-11-22 DIAGNOSIS — I2510 Atherosclerotic heart disease of native coronary artery without angina pectoris: Secondary | ICD-10-CM | POA: Diagnosis not present

## 2012-11-22 DIAGNOSIS — N289 Disorder of kidney and ureter, unspecified: Secondary | ICD-10-CM

## 2012-11-22 DIAGNOSIS — Z95810 Presence of automatic (implantable) cardiac defibrillator: Secondary | ICD-10-CM | POA: Diagnosis not present

## 2012-11-22 DIAGNOSIS — Z8674 Personal history of sudden cardiac arrest: Secondary | ICD-10-CM | POA: Diagnosis not present

## 2012-11-22 DIAGNOSIS — E785 Hyperlipidemia, unspecified: Secondary | ICD-10-CM | POA: Diagnosis not present

## 2012-11-22 DIAGNOSIS — I5022 Chronic systolic (congestive) heart failure: Secondary | ICD-10-CM | POA: Diagnosis not present

## 2012-11-22 DIAGNOSIS — I4891 Unspecified atrial fibrillation: Secondary | ICD-10-CM | POA: Diagnosis not present

## 2012-11-23 ENCOUNTER — Ambulatory Visit
Admission: RE | Admit: 2012-11-23 | Discharge: 2012-11-23 | Disposition: A | Discharge status: Home / Self Care | Payer: Medicare Other | Source: Ambulatory Visit | Attending: Cardiology | Admitting: Cardiology

## 2012-11-23 DIAGNOSIS — N281 Cyst of kidney, acquired: Secondary | ICD-10-CM | POA: Diagnosis not present

## 2012-11-23 DIAGNOSIS — N289 Disorder of kidney and ureter, unspecified: Secondary | ICD-10-CM

## 2013-01-06 ENCOUNTER — Encounter: Payer: Self-pay | Admitting: Internal Medicine

## 2013-01-17 ENCOUNTER — Encounter: Payer: Self-pay | Admitting: *Deleted

## 2013-01-18 ENCOUNTER — Encounter: Payer: Self-pay | Admitting: Internal Medicine

## 2013-01-18 ENCOUNTER — Ambulatory Visit (INDEPENDENT_AMBULATORY_CARE_PROVIDER_SITE_OTHER): Payer: Medicare Other | Admitting: Internal Medicine

## 2013-01-18 VITALS — BP 96/77 | HR 105 | Ht 71.0 in | Wt 180.0 lb

## 2013-01-18 DIAGNOSIS — Z95810 Presence of automatic (implantable) cardiac defibrillator: Secondary | ICD-10-CM | POA: Diagnosis not present

## 2013-01-18 DIAGNOSIS — I255 Ischemic cardiomyopathy: Secondary | ICD-10-CM

## 2013-01-18 DIAGNOSIS — I472 Ventricular tachycardia: Secondary | ICD-10-CM

## 2013-01-18 DIAGNOSIS — I513 Intracardiac thrombosis, not elsewhere classified: Secondary | ICD-10-CM

## 2013-01-18 DIAGNOSIS — I2589 Other forms of chronic ischemic heart disease: Secondary | ICD-10-CM | POA: Diagnosis not present

## 2013-01-18 DIAGNOSIS — I5023 Acute on chronic systolic (congestive) heart failure: Secondary | ICD-10-CM | POA: Insufficient documentation

## 2013-01-18 DIAGNOSIS — I5021 Acute systolic (congestive) heart failure: Secondary | ICD-10-CM | POA: Diagnosis not present

## 2013-01-18 DIAGNOSIS — I4891 Unspecified atrial fibrillation: Secondary | ICD-10-CM | POA: Diagnosis not present

## 2013-01-18 DIAGNOSIS — I5189 Other ill-defined heart diseases: Secondary | ICD-10-CM

## 2013-01-18 LAB — ICD DEVICE OBSERVATION
ATRIAL PACING ICD: 0 pct
BRDY-0002RV: 40 {beats}/min
DEVICE MODEL ICD: 7071894
FVT: 0
MODE SWITCH EPISODES: 0
PACEART VT: 0
RV LEAD THRESHOLD: 0.5 V
TZAT-0004SLOWVT: 8
TZAT-0013SLOWVT: 3
TZAT-0018SLOWVT: NEGATIVE
TZON-0005SLOWVT: 6
TZST-0001SLOWVT: 2
TZST-0001SLOWVT: 3
TZST-0001SLOWVT: 5
TZST-0003SLOWVT: 30 J
TZST-0003SLOWVT: 36 J
VENTRICULAR PACING ICD: 0.02 pct
VF: 2

## 2013-01-18 MED ORDER — LISINOPRIL 2.5 MG PO TABS: 2.5000 mg | ORAL_TABLET | Freq: Every day | ORAL | Status: DC

## 2013-01-18 MED ORDER — DIGOXIN 125 MCG PO TABS: 0.1250 mg | ORAL_TABLET | Freq: Every day | ORAL | Status: AC

## 2013-01-18 MED ORDER — METOPROLOL SUCCINATE ER 50 MG PO TB24: 50.0000 mg | ORAL_TABLET | Freq: Two times a day (BID) | ORAL | Status: DC

## 2013-01-18 MED ORDER — APIXABAN 5 MG PO TABS: 5.0000 mg | ORAL_TABLET | Freq: Two times a day (BID) | ORAL | Status: AC

## 2013-01-18 NOTE — Assessment & Plan Note (Addendum)
Permanent-rapid rate. We will change his metoprolol tartrate 2 succinate, down titrate his lisinopril and up titrate his beta blocker. We'll also add digoxin. He had normal renal function in January and we'll need a digoxin level in about 2 weeks time.  I'm concerned about the potential of his rapid rate to be aggravating his cardiomyopathy and his congestive symptoms. There may be a role for AV junction ablation with without CRT upgrade. I should note that his device has a plugged atrial port.  We also discussed anticoagulation. His CHADS-VASc score is quite high with  the presence of his left atrial thrombus and possible transient neurologic event. We have reviewed the [place] data and he is willing to try apixaban. We'll start 5 days after stopping his aspirin. I've encouraged him in the event that he would have recurrent bleeding either from his nose or his bladder at the right next strategy would be to have him see ENT or urology and try to remediate the bleeding source as opposed to discontinuing the blood thinner

## 2013-01-18 NOTE — Assessment & Plan Note (Signed)
We'll increase his Lasix from 40 daily to 80 daily for 5 days. Hopefully also by appropriate heart rate control we can improve his cardiomyopathy and reduce his symptoms of congestive heart failure.  In the event that we are unable to accomplish heart rate control, either amiodarone or AV junction ablation the next steps to consider

## 2013-01-18 NOTE — Patient Instructions (Addendum)
Medication changes:  STOP: Aspirin & metoprolol tartrate ( lopressor)                                         DECREASE:  Lisinopril to 2.5 mg daily                                                             START:   Digoxin 0.125 mg daily                                         START:  Apixiban (eliquis) 5mg  twice a day   5 DAYS AFTER YOU HAVE STOPPED YOUR ASPIRIN                                         START:  Metoprolol succinate 50mg  twice a day                                         INCREASE FOR 5 DAYS:  Furosemide ( lasix) 1 tablet twice a day   For 5 days only. Then resume your daily dose  You will need lab work in 2 weeks:  Digoxin level  Your physician wants you to follow-up in: 9 months with Dr. Graciela Husbands.  You will receive a reminder letter in the mail two months in advance. If you don't receive a letter, please call our office to schedule the follow-up appointment.

## 2013-01-18 NOTE — Assessment & Plan Note (Signed)
The patient's device was interrogated.  The information was reviewed. No changes were made in the programming.    

## 2013-01-18 NOTE — Assessment & Plan Note (Signed)
The patient had a cardiac arrest in January; he had recurrent rapid ventricular tachycardia/flutter June 2014 with failed ATP and successful shock therapy. No clear precipitant is evident although there has been progressive congestive heart failure manifested by edema, shortness of breath, and an elevated core view on his device  He is reminded regarding driving not to do it for 6 months

## 2013-01-18 NOTE — Progress Notes (Signed)
Patient Care Team: Emeterio Reeve, MD as PCP - General (Family Medicine) Othella Boyer, MD as Consulting Physician (Cardiology)   HPI  Tyler Cameron is a 72 y.o. male Seen in followup for aborted cardiac arrest jan 2014. He is a history of ischemic heart disease and prior stenting with depressed left ventricular function  He had  LHC 1/14>demonstrated patency of stent to LAD and the circumflex with occlusion of the right coronary artery with collaterals. EF 20%.   The patient notes increasing sob  He was shocked at night 6/25 of which he has no awareness  He has struggled with taking NOACs 2/2 epistaxsis and hematuria  Please see the urologist who found that he had sources of bleeding and wanted to undertake therapy. He declined.   Past Medical History  Diagnosis Date  . CAD (coronary artery disease) 07/30/2012    Inferior MI 2001 Stent to LAD and circ 2001 Refused cath 2008 in [place] Ischemic cardiomyopathy   . Ischemic cardiomyopathy 07/30/2012    Present since 2001 EKF 20% by ECHO 12/02/11   . Atrial fibrillation     Diagnosed 2008, Tried Pradaxa and Xarelto that caused hematuria Refuses warfarin   . Hypertensive heart disease without CHF   . Left atrial thrombus     He developed atrial fibrillation 2008 and was found to have a mass in his atrium on a CT scan. Confirmed by TEE that also showed spontaneous contrast.  He was placed on Pradaxa and had 2 TEE's  that eventually showed some reduction in the size of his thrombus but he refused to have a third TEE. He was taken off of Pradaxa and later placed on Xarelto but developed hematuria on Xarelto and quit taking this. He refused to consider going on warfarin and was willing to take the risks of stroke being on just aspirin alone   . Hyperlipidemia   . Herpes simplex of eye   . AICD (automatic cardioverter/defibrillator) present     Implant 08/02/12 Dr. Graciela Husbands RV lead St[employer] Jude 7122 Q  serial #[id number].  Generator  St. Jude CD   [phone] Q , serial [id number]   . Diabetes mellitus type 2, noninsulin dependent     Past Surgical History  Procedure Laterality Date  . Coronary angioplasty with stent placement  2001  . Anal fissurectomy    . Corneal transplant    . Retinal detachment surgery    . Cardiac defibrillator placement  08/02/12    St. Jude device    Current Outpatient Prescriptions  Medication Sig Dispense Refill  . aspirin EC 81 MG tablet Take 81 mg by mouth daily.      . furosemide (LASIX) 40 MG tablet Take 40 mg by mouth daily.      Marland Kitchen lisinopril (PRINIVIL,ZESTRIL) 10 MG tablet Take 10 mg by mouth daily.      . metoprolol (LOPRESSOR) 50 MG tablet Take 1 tablet (50 mg total) by mouth 2 (two) times daily.  60 tablet  12  . nitroGLYCERIN (NITROSTAT) 0.4 MG SL tablet Place 1 tablet (0.4 mg total) under the tongue every 5 (five) minutes x 3 doses as needed for chest pain.  25 tablet  12   No current facility-administered medications for this visit.    Allergies  Allergen Reactions  . Lopid (Gemfibrozil)     "Ate away my muscles"  . Pradaxa (Dabigatran Etexilate Mesylate) Other (See Comments)    unknown  . Xarelto (Rivaroxaban)     Review  of Systems negative except from HPI and PMH  Physical Exam BP 96/77  Pulse 105  Ht 5\' 11"  (1.803 m)  Wt 180 lb (81.647 kg)  BMI 25.12 kg/m2 Well developed and nourished in no acute distress HENT normal  Blind L eye Neck supple with JVP-7-8 Clear Fast and irregular apical murmur Abd-soft with active BS No Clubbing cyanosis 2+edema Skin-warm and dry A & Oriented  Grossly normal sensory and motor function  ECG demonstrates atrial fibrillation 110 beats per minute   Assessment and  Plan

## 2013-01-18 NOTE — Assessment & Plan Note (Signed)
As above.

## 2013-01-18 NOTE — Assessment & Plan Note (Signed)
As above We will decrease her lisinopril.

## 2013-02-01 ENCOUNTER — Other Ambulatory Visit: Payer: Medicare Other

## 2013-02-01 DIAGNOSIS — I2589 Other forms of chronic ischemic heart disease: Secondary | ICD-10-CM | POA: Diagnosis not present

## 2013-02-01 DIAGNOSIS — I255 Ischemic cardiomyopathy: Secondary | ICD-10-CM

## 2013-02-01 DIAGNOSIS — I4891 Unspecified atrial fibrillation: Secondary | ICD-10-CM | POA: Diagnosis not present

## 2013-02-21 DIAGNOSIS — R634 Abnormal weight loss: Secondary | ICD-10-CM | POA: Diagnosis not present

## 2013-02-21 DIAGNOSIS — F17200 Nicotine dependence, unspecified, uncomplicated: Secondary | ICD-10-CM | POA: Diagnosis not present

## 2013-02-21 DIAGNOSIS — Z79899 Other long term (current) drug therapy: Secondary | ICD-10-CM | POA: Diagnosis not present

## 2013-02-21 DIAGNOSIS — E1129 Type 2 diabetes mellitus with other diabetic kidney complication: Secondary | ICD-10-CM | POA: Diagnosis not present

## 2013-02-21 DIAGNOSIS — D72829 Elevated white blood cell count, unspecified: Secondary | ICD-10-CM | POA: Diagnosis not present

## 2013-02-21 DIAGNOSIS — Z0000 Encounter for general adult medical examination without abnormal findings: Secondary | ICD-10-CM | POA: Diagnosis not present

## 2013-02-21 DIAGNOSIS — E46 Unspecified protein-calorie malnutrition: Secondary | ICD-10-CM | POA: Diagnosis not present

## 2013-03-14 DIAGNOSIS — E785 Hyperlipidemia, unspecified: Secondary | ICD-10-CM | POA: Diagnosis not present

## 2013-03-14 DIAGNOSIS — Z95810 Presence of automatic (implantable) cardiac defibrillator: Secondary | ICD-10-CM | POA: Diagnosis not present

## 2013-03-14 DIAGNOSIS — E119 Type 2 diabetes mellitus without complications: Secondary | ICD-10-CM | POA: Diagnosis not present

## 2013-03-14 DIAGNOSIS — I4891 Unspecified atrial fibrillation: Secondary | ICD-10-CM | POA: Diagnosis not present

## 2013-03-14 DIAGNOSIS — I5022 Chronic systolic (congestive) heart failure: Secondary | ICD-10-CM | POA: Diagnosis not present

## 2013-03-14 DIAGNOSIS — I2589 Other forms of chronic ischemic heart disease: Secondary | ICD-10-CM | POA: Diagnosis not present

## 2013-03-14 DIAGNOSIS — Z8674 Personal history of sudden cardiac arrest: Secondary | ICD-10-CM | POA: Diagnosis not present

## 2013-03-14 DIAGNOSIS — I2510 Atherosclerotic heart disease of native coronary artery without angina pectoris: Secondary | ICD-10-CM | POA: Diagnosis not present

## 2013-04-24 ENCOUNTER — Ambulatory Visit (INDEPENDENT_AMBULATORY_CARE_PROVIDER_SITE_OTHER): Payer: Medicare Other | Admitting: *Deleted

## 2013-04-24 DIAGNOSIS — I5023 Acute on chronic systolic (congestive) heart failure: Secondary | ICD-10-CM | POA: Diagnosis not present

## 2013-04-24 DIAGNOSIS — Z95810 Presence of automatic (implantable) cardiac defibrillator: Secondary | ICD-10-CM | POA: Diagnosis not present

## 2013-04-24 DIAGNOSIS — I255 Ischemic cardiomyopathy: Secondary | ICD-10-CM

## 2013-04-24 DIAGNOSIS — I2589 Other forms of chronic ischemic heart disease: Secondary | ICD-10-CM | POA: Diagnosis not present

## 2013-04-26 LAB — REMOTE ICD DEVICE
DEVICE MODEL ICD: 7071894
RV LEAD IMPEDENCE ICD: 440 Ohm
TZAT-0012SLOWVT: 200 ms
TZAT-0013SLOWVT: 3
TZAT-0018SLOWVT: NEGATIVE
TZAT-0019SLOWVT: 7.5 V
TZAT-0020SLOWVT: 1 ms
TZON-0003SLOWVT: 300 ms
TZON-0004SLOWVT: 35
TZON-0005SLOWVT: 6
TZST-0001SLOWVT: 4
TZST-0003SLOWVT: 30 J
TZST-0003SLOWVT: 36 J

## 2013-05-03 ENCOUNTER — Encounter: Payer: Self-pay | Admitting: Internal Medicine

## 2013-05-18 ENCOUNTER — Other Ambulatory Visit: Payer: Self-pay

## 2013-05-22 ENCOUNTER — Encounter: Payer: Self-pay | Admitting: Internal Medicine

## 2013-06-13 DIAGNOSIS — Z95810 Presence of automatic (implantable) cardiac defibrillator: Secondary | ICD-10-CM | POA: Diagnosis not present

## 2013-06-13 DIAGNOSIS — I4891 Unspecified atrial fibrillation: Secondary | ICD-10-CM | POA: Diagnosis not present

## 2013-06-13 DIAGNOSIS — I5022 Chronic systolic (congestive) heart failure: Secondary | ICD-10-CM | POA: Diagnosis not present

## 2013-06-13 DIAGNOSIS — Z8674 Personal history of sudden cardiac arrest: Secondary | ICD-10-CM | POA: Diagnosis not present

## 2013-06-13 DIAGNOSIS — I2510 Atherosclerotic heart disease of native coronary artery without angina pectoris: Secondary | ICD-10-CM | POA: Diagnosis not present

## 2013-06-13 DIAGNOSIS — E785 Hyperlipidemia, unspecified: Secondary | ICD-10-CM | POA: Diagnosis not present

## 2013-06-13 DIAGNOSIS — I2589 Other forms of chronic ischemic heart disease: Secondary | ICD-10-CM | POA: Diagnosis not present

## 2013-06-13 DIAGNOSIS — E119 Type 2 diabetes mellitus without complications: Secondary | ICD-10-CM | POA: Diagnosis not present

## 2013-07-25 ENCOUNTER — Encounter: Payer: Medicare Other | Admitting: *Deleted

## 2013-07-25 DIAGNOSIS — I255 Ischemic cardiomyopathy: Secondary | ICD-10-CM

## 2013-07-25 DIAGNOSIS — I472 Ventricular tachycardia, unspecified: Secondary | ICD-10-CM

## 2013-07-25 DIAGNOSIS — I5023 Acute on chronic systolic (congestive) heart failure: Secondary | ICD-10-CM

## 2013-07-25 LAB — MDC_IDC_ENUM_SESS_TYPE_REMOTE
HIGH POWER IMPEDANCE MEASURED VALUE: 73 Ohm
Lead Channel Sensing Intrinsic Amplitude: 11.7 mV
Lead Channel Setting Pacing Amplitude: 2.5 V
Lead Channel Setting Pacing Pulse Width: 0.5 ms
MDC IDC MSMT LEADCHNL RV IMPEDANCE VALUE: 440 Ohm
MDC IDC PG MODEL: 2411
MDC IDC PG SERIAL: 7071894
MDC IDC SET LEADCHNL RV SENSING SENSITIVITY: 0.5 mV
MDC IDC STAT BRADY RV PERCENT PACED: 1 % — AB
Zone Setting Detection Interval: 250 ms
Zone Setting Detection Interval: 300 ms

## 2013-08-16 ENCOUNTER — Encounter: Payer: Self-pay | Admitting: *Deleted

## 2013-08-24 ENCOUNTER — Encounter: Payer: Self-pay | Admitting: Internal Medicine

## 2013-08-24 DIAGNOSIS — B0089 Other herpesviral infection: Secondary | ICD-10-CM | POA: Diagnosis not present

## 2013-08-24 DIAGNOSIS — M25569 Pain in unspecified knee: Secondary | ICD-10-CM | POA: Diagnosis not present

## 2013-08-24 DIAGNOSIS — I509 Heart failure, unspecified: Secondary | ICD-10-CM | POA: Diagnosis not present

## 2013-08-24 DIAGNOSIS — N182 Chronic kidney disease, stage 2 (mild): Secondary | ICD-10-CM | POA: Diagnosis not present

## 2013-08-24 DIAGNOSIS — E1129 Type 2 diabetes mellitus with other diabetic kidney complication: Secondary | ICD-10-CM | POA: Diagnosis not present

## 2013-10-09 DIAGNOSIS — B029 Zoster without complications: Secondary | ICD-10-CM | POA: Diagnosis not present

## 2013-10-26 ENCOUNTER — Ambulatory Visit (INDEPENDENT_AMBULATORY_CARE_PROVIDER_SITE_OTHER): Payer: Medicare Other | Admitting: *Deleted

## 2013-10-26 ENCOUNTER — Encounter: Payer: Self-pay | Admitting: Internal Medicine

## 2013-10-26 DIAGNOSIS — I255 Ischemic cardiomyopathy: Secondary | ICD-10-CM

## 2013-10-26 DIAGNOSIS — I119 Hypertensive heart disease without heart failure: Secondary | ICD-10-CM

## 2013-10-26 DIAGNOSIS — I5023 Acute on chronic systolic (congestive) heart failure: Secondary | ICD-10-CM | POA: Diagnosis not present

## 2013-10-26 DIAGNOSIS — I2589 Other forms of chronic ischemic heart disease: Secondary | ICD-10-CM | POA: Diagnosis not present

## 2013-10-26 DIAGNOSIS — I472 Ventricular tachycardia, unspecified: Secondary | ICD-10-CM

## 2013-10-26 DIAGNOSIS — I4891 Unspecified atrial fibrillation: Secondary | ICD-10-CM

## 2013-10-26 DIAGNOSIS — I4729 Other ventricular tachycardia: Secondary | ICD-10-CM | POA: Diagnosis not present

## 2013-10-26 LAB — MDC_IDC_ENUM_SESS_TYPE_REMOTE
Battery Remaining Longevity: 7.5
Battery Remaining Percentage: 88 %
HIGH POWER IMPEDANCE MEASURED VALUE: 72 Ohm
Implantable Pulse Generator Serial Number: 7071894
Lead Channel Impedance Value: 450 Ohm
Lead Channel Sensing Intrinsic Amplitude: 11.8 mV
Lead Channel Setting Sensing Sensitivity: 0.5 mV
MDC IDC PG MODEL: 2411
MDC IDC SET LEADCHNL RV PACING AMPLITUDE: 2.5 V
MDC IDC SET LEADCHNL RV PACING PULSEWIDTH: 0.5 ms
MDC IDC SET ZONE DETECTION INTERVAL: 250 ms
MDC IDC SET ZONE DETECTION INTERVAL: 300 ms
MDC IDC STAT BRADY RV PERCENT PACED: 1 % — AB

## 2013-10-30 ENCOUNTER — Encounter: Payer: Self-pay | Admitting: Internal Medicine

## 2013-10-30 ENCOUNTER — Ambulatory Visit (INDEPENDENT_AMBULATORY_CARE_PROVIDER_SITE_OTHER): Payer: Medicare Other | Admitting: *Deleted

## 2013-10-30 DIAGNOSIS — I2589 Other forms of chronic ischemic heart disease: Secondary | ICD-10-CM | POA: Diagnosis not present

## 2013-10-30 DIAGNOSIS — I255 Ischemic cardiomyopathy: Secondary | ICD-10-CM

## 2013-10-30 DIAGNOSIS — I5023 Acute on chronic systolic (congestive) heart failure: Secondary | ICD-10-CM | POA: Diagnosis not present

## 2013-11-02 LAB — MDC_IDC_ENUM_SESS_TYPE_REMOTE
Battery Remaining Longevity: 89 mo
Battery Remaining Percentage: 88 %
Battery Voltage: 3.07 V
HIGH POWER IMPEDANCE MEASURED VALUE: 71 Ohm
HighPow Impedance: 71 Ohm
Implantable Pulse Generator Model: 2411
Lead Channel Pacing Threshold Amplitude: 0.5 V
Lead Channel Pacing Threshold Pulse Width: 0.5 ms
Lead Channel Sensing Intrinsic Amplitude: 11.7 mV
Lead Channel Setting Pacing Amplitude: 2.5 V
Lead Channel Setting Pacing Pulse Width: 0.5 ms
MDC IDC MSMT LEADCHNL RV IMPEDANCE VALUE: 410 Ohm
MDC IDC PG SERIAL: 7071894
MDC IDC SESS DTM: 20150420175425
MDC IDC SET LEADCHNL RV SENSING SENSITIVITY: 0.5 mV
MDC IDC STAT BRADY RV PERCENT PACED: 1 %
Zone Setting Detection Interval: 250 ms
Zone Setting Detection Interval: 300 ms

## 2013-11-13 NOTE — Progress Notes (Signed)
ICD remote with ICM 

## 2013-11-15 ENCOUNTER — Encounter: Payer: Self-pay | Admitting: Cardiology

## 2013-12-18 DIAGNOSIS — Z95810 Presence of automatic (implantable) cardiac defibrillator: Secondary | ICD-10-CM | POA: Diagnosis not present

## 2013-12-18 DIAGNOSIS — E119 Type 2 diabetes mellitus without complications: Secondary | ICD-10-CM | POA: Diagnosis not present

## 2013-12-18 DIAGNOSIS — I2510 Atherosclerotic heart disease of native coronary artery without angina pectoris: Secondary | ICD-10-CM | POA: Diagnosis not present

## 2013-12-18 DIAGNOSIS — I5022 Chronic systolic (congestive) heart failure: Secondary | ICD-10-CM | POA: Diagnosis not present

## 2013-12-18 DIAGNOSIS — I2589 Other forms of chronic ischemic heart disease: Secondary | ICD-10-CM | POA: Diagnosis not present

## 2013-12-18 DIAGNOSIS — Z8674 Personal history of sudden cardiac arrest: Secondary | ICD-10-CM | POA: Diagnosis not present

## 2013-12-18 DIAGNOSIS — I4891 Unspecified atrial fibrillation: Secondary | ICD-10-CM | POA: Diagnosis not present

## 2013-12-18 DIAGNOSIS — I10 Essential (primary) hypertension: Secondary | ICD-10-CM | POA: Diagnosis not present

## 2014-02-20 ENCOUNTER — Encounter: Payer: Self-pay | Admitting: *Deleted

## 2014-03-06 ENCOUNTER — Encounter: Payer: Self-pay | Admitting: *Deleted

## 2014-03-07 ENCOUNTER — Encounter: Payer: Medicare Other | Admitting: Internal Medicine

## 2014-03-23 ENCOUNTER — Encounter: Payer: Self-pay | Admitting: Internal Medicine

## 2014-04-12 DIAGNOSIS — I480 Paroxysmal atrial fibrillation: Secondary | ICD-10-CM | POA: Diagnosis not present

## 2014-04-12 DIAGNOSIS — Z79899 Other long term (current) drug therapy: Secondary | ICD-10-CM | POA: Diagnosis not present

## 2014-04-12 DIAGNOSIS — E1121 Type 2 diabetes mellitus with diabetic nephropathy: Secondary | ICD-10-CM | POA: Diagnosis not present

## 2014-04-12 DIAGNOSIS — Z23 Encounter for immunization: Secondary | ICD-10-CM | POA: Diagnosis not present

## 2014-04-12 DIAGNOSIS — N182 Chronic kidney disease, stage 2 (mild): Secondary | ICD-10-CM | POA: Diagnosis not present

## 2014-04-12 DIAGNOSIS — F17210 Nicotine dependence, cigarettes, uncomplicated: Secondary | ICD-10-CM | POA: Diagnosis not present

## 2014-04-12 DIAGNOSIS — Z0000 Encounter for general adult medical examination without abnormal findings: Secondary | ICD-10-CM | POA: Diagnosis not present

## 2014-04-12 DIAGNOSIS — E782 Mixed hyperlipidemia: Secondary | ICD-10-CM | POA: Diagnosis not present

## 2014-04-12 DIAGNOSIS — Z720 Tobacco use: Secondary | ICD-10-CM | POA: Diagnosis not present

## 2014-04-17 ENCOUNTER — Encounter: Payer: Self-pay | Admitting: *Deleted

## 2014-05-23 ENCOUNTER — Encounter: Payer: Self-pay | Admitting: *Deleted

## 2014-06-21 ENCOUNTER — Encounter (HOSPITAL_COMMUNITY): Payer: Self-pay | Admitting: Cardiology

## 2014-06-22 ENCOUNTER — Encounter: Payer: Self-pay | Admitting: Cardiology

## 2014-06-22 DIAGNOSIS — I482 Chronic atrial fibrillation: Secondary | ICD-10-CM | POA: Diagnosis not present

## 2014-06-22 DIAGNOSIS — I2510 Atherosclerotic heart disease of native coronary artery without angina pectoris: Secondary | ICD-10-CM | POA: Diagnosis not present

## 2014-06-22 DIAGNOSIS — Z8674 Personal history of sudden cardiac arrest: Secondary | ICD-10-CM | POA: Diagnosis not present

## 2014-06-22 DIAGNOSIS — I5022 Chronic systolic (congestive) heart failure: Secondary | ICD-10-CM | POA: Diagnosis not present

## 2014-06-22 DIAGNOSIS — I255 Ischemic cardiomyopathy: Secondary | ICD-10-CM | POA: Diagnosis not present

## 2014-06-22 DIAGNOSIS — Z95810 Presence of automatic (implantable) cardiac defibrillator: Secondary | ICD-10-CM | POA: Diagnosis not present

## 2014-06-22 DIAGNOSIS — E785 Hyperlipidemia, unspecified: Secondary | ICD-10-CM | POA: Diagnosis not present

## 2014-06-22 DIAGNOSIS — E119 Type 2 diabetes mellitus without complications: Secondary | ICD-10-CM | POA: Diagnosis not present

## 2014-06-22 NOTE — Progress Notes (Signed)
Patient ID: Tyler Cameron, male   DOB: 1941-06-27, 73 y.o.   MRN: [id number]   Tyler, Cameron    Date of visit:  06/22/2014 DOB:  February 18, 1941    Age:  73 yrs. Medical record number:  [id number]     Account number:  [id number] Primary Care Provider: Jonathon Jordan ____________________________ CURRENT DIAGNOSES  1. Chronic systolic (congestive) heart failure  2. Personal history of sudden cardiac arrest  3. Presence of automatic (implantable) cardiac defibrillator  4. Ischemic cardiomyopathy  5. Atherosclerotic heart disease of native coronary artery without angina pectoris  6. Chronic atrial fibrillation  7. Type 2 diabetes mellitus without complications  8. Hyperlipidemia, unspecified  9. Essential (primary) hypertension  10. Dyspnea, unspecified ____________________________ ALLERGIES  Abciximab, Thrombocytopenic disorder  Dabigatran etexilate, Indigestion  Gemfibrozil, Muscle weakness  rivaroxaban, Hematuria (blood in the urine) ____________________________ MEDICATIONS  1. nitroglycerin 0.4 mg tablet, sublingual, PRN  2. triamcinolone acetonide 0.1 % Ointment, PRN  3. Eliquis 5 mg tablet, BID  4. furosemide 40 mg tablet, 1 p.o. daily  5. Tylenol-Codeine #3 300 mg-30 mg tablet, PRN  6. digoxin 125 mcg tablet, 1 p.o. daily  7. lisinopril 2.5 mg tablet, 1 p.o. daily  8. metoprolol succinate ER 50 mg tablet,extended release 24 hr, BID ____________________________ CHIEF COMPLAINTS  Followup of Chronic atrial fibrillation  Followup of Chronic systolic (congestive) heart failure ____________________________ HISTORY OF PRESENT ILLNESS Patient seen for cardiac followup. He has been doing well from a cardiovascular viewpoint and tries to walk but finds it gives out after about a mile of the treadmill. He has had no defibrillator discharges. He denies PND, orthopnea or claudication. He really doesn't complain of a lot of shortness of breath. He does have chronic atrial fibrillation and has no  bleeding complications from Eliquis which he is tolerating fairly well. He states that he has been transmitting his defibrillator but hasn't heard anything for a while according to results from the electrophysiology office has not had a remote check since May. He does not wish to take statins.  ____________________________ PAST HISTORY  Past Medical Illnesses:  hypertension, hyperlipidemia, history of herpes simplex of eye, shingles;  Cardiovascular Illnesses:  CAD, atrial fibrillation, cardiomyopathy(ischemic), history of left atrial thrombus;  Surgical Procedures:  anal fissurotomy, corneal transplant, detachet retina;  NYHA Classification:  II;  [place] Angina Classification:  Class 0: Asymptomatic;  Cardiology Procedures-Invasive:  cardiac cath (left), stent to LAD and circ 2001, St. Jude AICD implant January 2014, cardiac cath (left) January 2014;  Cardiology Procedures-Noninvasive:  lexiscan cardiolite April 2010, TEE October 2011, echocardiogram May 2013, echocardiogram January 2014;  Cardiac Cath Results:  normal Left main, calcified LAD Ramus RCA, 30% stenosis mid LAD, widely patent CFX, occluded RCA;  LVEF of 20% documented via echocardiogram on 12/02/2011,  CHADS Score:  3,  CHA2DS2-VASC Score:  5 ____________________________ CARDIO-PULMONARY TEST DATES EKG Date:  06/22/2014;   Cardiac Cath Date:  07/31/2012;  Nuclear Study Date:  10/24/2010;  Echocardiography Date: 07/30/2012;  Chest Xray Date: 11/11/2012;   ____________________________ FAMILY HISTORY Brother -- Brother alive and well Father -- Father dead, Coronary Artery Disease, Deceased Mother -- Mother dead, Unknown Disease, Deceased Sister -- Diabetes mellitus, Sister alive with problem Sister -- Sister dead, Drug addiction Sister -- Sister alive and well ____________________________ SOCIAL HISTORY Alcohol Use:  socially;  Smoking:  smokes 1 ppd, 50 pack year history;  Diet:  no added salt;  Lifestyle:  divorced;  Residence:   lives alone;   ____________________________ REVIEW OF SYSTEMS  General:  malaise and fatigue, weight gain of approximately 10 lbs 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 Neurological:  neuropathy  ____________________________ PHYSICAL EXAMINATION VITAL SIGNS  Blood Pressure:  104/60 Sitting, Left arm, regular cuff  , 100/66 Standing, Left arm and regular cuff   Pulse:  76/min. Weight:  191.00 lbs. Height:  71"BMI: 26  Constitutional:  pleasant bearded white male in no acute distress Head:  normocephalic, normal hair pattern, no masses or tenderness Eyes:  left eye cornea cloudy Neck:  supple, without massess. No JVD, thyromegaly or carotid bruits. Carotid upstroke normal. Chest:  normal symmetry, clear to auscultation and percussion., healed ICD incision in the left pectoral area Cardiac:  irregularly irregular rhythm, normal S1 and S2, no S3 or S4, no murmur Peripheral Pulses:  pulses below the femoral arteries are diminished Extremities & Back:  trace edema Neurological:  no gross motor or sensory deficits noted, affect appropriate, oriented x3. ____________________________ MOST RECENT LIPID PANEL 04/13/14  CHOL TOTL 224 mg/dl, LDL 193 NM, HDL 32 mg/dl, TRIGLYCER 690 mg/dl and CHOL/HDL 7.1 (Calc) ____________________________ IMPRESSIONS/PLAN  1. Idiopathic cardiomyopathy-class II 2. Functioning implantable defibrillator 3. Chronic atrial fibrillation rate controlled on EKG 4. Long-term anticoagulation with Eliquis without complications  Recommendations:  Suspect exercise intolerance may be due in part to the beta blocker. Spoke with the electrophysiology office about defibrillator followup and they will contact the patient. Clinically doing  well otherwise and will see in followup in 6 months.  He reports his dig level was low and I would not change his dose at his age as this is given to help with rate control and LV function.  ____________________________ TODAYS ORDERS  1. 12 Lead EKG: Today  2. Return Visit: 6 months                       ____________________________ Cardiology Physician:  Kerry Hough MD [facility]

## 2014-06-26 ENCOUNTER — Encounter: Payer: Self-pay | Admitting: *Deleted

## 2014-07-11 ENCOUNTER — Encounter: Payer: Self-pay | Admitting: Internal Medicine

## 2014-07-24 ENCOUNTER — Encounter: Payer: Self-pay | Admitting: *Deleted

## 2014-07-30 ENCOUNTER — Encounter: Payer: Self-pay | Admitting: Internal Medicine

## 2014-07-30 ENCOUNTER — Telehealth: Payer: Self-pay | Admitting: Internal Medicine

## 2014-07-30 NOTE — Telephone Encounter (Signed)
Called pt to set up missed past due defib , he said no, wants to know what he would get out of it, can't he just send remotes?? pls advise

## 2014-08-07 NOTE — Telephone Encounter (Signed)
LMOM in regards to scheduling appointment.

## 2014-09-06 ENCOUNTER — Encounter: Payer: Self-pay | Admitting: *Deleted

## 2014-10-11 ENCOUNTER — Encounter: Payer: Self-pay | Admitting: *Deleted

## 2014-11-07 ENCOUNTER — Encounter: Payer: Self-pay | Admitting: *Deleted

## 2014-12-14 ENCOUNTER — Encounter: Payer: Self-pay | Admitting: *Deleted

## 2015-01-15 ENCOUNTER — Ambulatory Visit
Admission: RE | Admit: 2015-01-15 | Discharge: 2015-01-15 | Disposition: A | Discharge status: Home / Self Care | Payer: Medicare Other | Source: Ambulatory Visit | Attending: Cardiology | Admitting: Cardiology

## 2015-01-15 ENCOUNTER — Other Ambulatory Visit: Payer: Self-pay | Admitting: Cardiology

## 2015-01-15 DIAGNOSIS — Z7901 Long term (current) use of anticoagulants: Secondary | ICD-10-CM | POA: Diagnosis not present

## 2015-01-15 DIAGNOSIS — I5022 Chronic systolic (congestive) heart failure: Secondary | ICD-10-CM | POA: Diagnosis not present

## 2015-01-15 DIAGNOSIS — N183 Chronic kidney disease, stage 3 (moderate): Secondary | ICD-10-CM | POA: Diagnosis not present

## 2015-01-15 DIAGNOSIS — E119 Type 2 diabetes mellitus without complications: Secondary | ICD-10-CM | POA: Diagnosis not present

## 2015-01-15 DIAGNOSIS — I255 Ischemic cardiomyopathy: Secondary | ICD-10-CM | POA: Diagnosis not present

## 2015-01-15 DIAGNOSIS — R0600 Dyspnea, unspecified: Secondary | ICD-10-CM | POA: Diagnosis not present

## 2015-01-15 DIAGNOSIS — E785 Hyperlipidemia, unspecified: Secondary | ICD-10-CM | POA: Diagnosis not present

## 2015-01-15 DIAGNOSIS — I482 Chronic atrial fibrillation: Secondary | ICD-10-CM | POA: Diagnosis not present

## 2015-01-15 DIAGNOSIS — R5383 Other fatigue: Secondary | ICD-10-CM | POA: Diagnosis not present

## 2015-01-15 DIAGNOSIS — Z8674 Personal history of sudden cardiac arrest: Secondary | ICD-10-CM | POA: Diagnosis not present

## 2015-01-15 DIAGNOSIS — I2510 Atherosclerotic heart disease of native coronary artery without angina pectoris: Secondary | ICD-10-CM | POA: Diagnosis not present

## 2015-01-15 DIAGNOSIS — E1142 Type 2 diabetes mellitus with diabetic polyneuropathy: Secondary | ICD-10-CM | POA: Diagnosis not present

## 2015-01-15 DIAGNOSIS — I2589 Other forms of chronic ischemic heart disease: Secondary | ICD-10-CM | POA: Diagnosis not present

## 2015-01-15 DIAGNOSIS — Z95810 Presence of automatic (implantable) cardiac defibrillator: Secondary | ICD-10-CM | POA: Diagnosis not present

## 2015-01-15 DIAGNOSIS — R0602 Shortness of breath: Secondary | ICD-10-CM | POA: Diagnosis not present

## 2015-01-15 DIAGNOSIS — I10 Essential (primary) hypertension: Secondary | ICD-10-CM | POA: Diagnosis not present

## 2015-01-21 DIAGNOSIS — R740 Nonspecific elevation of levels of transaminase and lactic acid dehydrogenase [LDH]: Secondary | ICD-10-CM | POA: Diagnosis not present

## 2015-01-21 DIAGNOSIS — E1122 Type 2 diabetes mellitus with diabetic chronic kidney disease: Secondary | ICD-10-CM | POA: Diagnosis not present

## 2015-01-21 DIAGNOSIS — Z79899 Other long term (current) drug therapy: Secondary | ICD-10-CM | POA: Diagnosis not present

## 2015-01-21 DIAGNOSIS — N182 Chronic kidney disease, stage 2 (mild): Secondary | ICD-10-CM | POA: Diagnosis not present

## 2015-01-21 DIAGNOSIS — N179 Acute kidney failure, unspecified: Secondary | ICD-10-CM | POA: Diagnosis not present

## 2015-01-21 DIAGNOSIS — D72829 Elevated white blood cell count, unspecified: Secondary | ICD-10-CM | POA: Diagnosis not present

## 2015-01-21 DIAGNOSIS — I509 Heart failure, unspecified: Secondary | ICD-10-CM | POA: Diagnosis not present

## 2015-01-21 DIAGNOSIS — Z7901 Long term (current) use of anticoagulants: Secondary | ICD-10-CM | POA: Diagnosis not present

## 2015-01-21 DIAGNOSIS — Z794 Long term (current) use of insulin: Secondary | ICD-10-CM | POA: Diagnosis not present

## 2015-01-21 DIAGNOSIS — R634 Abnormal weight loss: Secondary | ICD-10-CM | POA: Diagnosis not present

## 2015-01-21 DIAGNOSIS — E46 Unspecified protein-calorie malnutrition: Secondary | ICD-10-CM | POA: Diagnosis not present

## 2015-01-22 ENCOUNTER — Other Ambulatory Visit: Payer: Self-pay | Admitting: Family Medicine

## 2015-01-22 ENCOUNTER — Ambulatory Visit
Admission: RE | Admit: 2015-01-22 | Discharge: 2015-01-22 | Disposition: A | Discharge status: Home / Self Care | Payer: Medicare Other | Source: Ambulatory Visit | Attending: Family Medicine | Admitting: Family Medicine

## 2015-01-22 DIAGNOSIS — R634 Abnormal weight loss: Secondary | ICD-10-CM | POA: Diagnosis not present

## 2015-01-22 DIAGNOSIS — C762 Malignant neoplasm of abdomen: Secondary | ICD-10-CM

## 2015-01-22 DIAGNOSIS — R312 Other microscopic hematuria: Secondary | ICD-10-CM | POA: Diagnosis not present

## 2015-01-22 DIAGNOSIS — R161 Splenomegaly, not elsewhere classified: Secondary | ICD-10-CM | POA: Diagnosis not present

## 2015-01-22 DIAGNOSIS — K5730 Diverticulosis of large intestine without perforation or abscess without bleeding: Secondary | ICD-10-CM | POA: Diagnosis not present

## 2015-01-22 MED ORDER — IOHEXOL 300 MG/ML  SOLN
30.0000 mL | Freq: Once | INTRAMUSCULAR | Status: AC | PRN
  Administered 2015-01-22: 30 mL via ORAL

## 2015-01-23 ENCOUNTER — Encounter (HOSPITAL_COMMUNITY): Payer: Self-pay | Admitting: *Deleted

## 2015-01-23 ENCOUNTER — Emergency Department (HOSPITAL_COMMUNITY)
Admission: EM | Admit: 2015-01-23 | Discharge: 2015-01-23 | Disposition: A | Discharge status: Home / Self Care | Payer: Medicare Other | Attending: Emergency Medicine | Admitting: Emergency Medicine

## 2015-01-23 DIAGNOSIS — Z86718 Personal history of other venous thrombosis and embolism: Secondary | ICD-10-CM | POA: Diagnosis not present

## 2015-01-23 DIAGNOSIS — N289 Disorder of kidney and ureter, unspecified: Secondary | ICD-10-CM | POA: Insufficient documentation

## 2015-01-23 DIAGNOSIS — E1165 Type 2 diabetes mellitus with hyperglycemia: Secondary | ICD-10-CM | POA: Insufficient documentation

## 2015-01-23 DIAGNOSIS — Z9889 Other specified postprocedural states: Secondary | ICD-10-CM | POA: Diagnosis not present

## 2015-01-23 DIAGNOSIS — I4891 Unspecified atrial fibrillation: Secondary | ICD-10-CM | POA: Diagnosis not present

## 2015-01-23 DIAGNOSIS — R944 Abnormal results of kidney function studies: Secondary | ICD-10-CM | POA: Diagnosis not present

## 2015-01-23 DIAGNOSIS — Z79899 Other long term (current) drug therapy: Secondary | ICD-10-CM | POA: Insufficient documentation

## 2015-01-23 DIAGNOSIS — Z95810 Presence of automatic (implantable) cardiac defibrillator: Secondary | ICD-10-CM | POA: Insufficient documentation

## 2015-01-23 DIAGNOSIS — Z8639 Personal history of other endocrine, nutritional and metabolic disease: Secondary | ICD-10-CM | POA: Diagnosis not present

## 2015-01-23 DIAGNOSIS — R739 Hyperglycemia, unspecified: Secondary | ICD-10-CM | POA: Diagnosis not present

## 2015-01-23 DIAGNOSIS — I2510 Atherosclerotic heart disease of native coronary artery without angina pectoris: Secondary | ICD-10-CM | POA: Diagnosis not present

## 2015-01-23 DIAGNOSIS — Z7901 Long term (current) use of anticoagulants: Secondary | ICD-10-CM | POA: Diagnosis not present

## 2015-01-23 DIAGNOSIS — Z8619 Personal history of other infectious and parasitic diseases: Secondary | ICD-10-CM | POA: Diagnosis not present

## 2015-01-23 DIAGNOSIS — R7309 Other abnormal glucose: Secondary | ICD-10-CM | POA: Diagnosis not present

## 2015-01-23 DIAGNOSIS — I119 Hypertensive heart disease without heart failure: Secondary | ICD-10-CM | POA: Diagnosis not present

## 2015-01-23 DIAGNOSIS — Z9861 Coronary angioplasty status: Secondary | ICD-10-CM | POA: Diagnosis not present

## 2015-01-23 LAB — CBG MONITORING, ED
GLUCOSE-CAPILLARY: 292 mg/dL — AB (ref 65–99)
Glucose-Capillary: 244 mg/dL — ABNORMAL HIGH (ref 65–99)

## 2015-01-23 LAB — BASIC METABOLIC PANEL
ANION GAP: 8 (ref 5–15)
BUN: 41 mg/dL — ABNORMAL HIGH (ref 6–20)
CO2: 29 mmol/L (ref 22–32)
CREATININE: 1.46 mg/dL — AB (ref 0.61–1.24)
Calcium: 8.2 mg/dL — ABNORMAL LOW (ref 8.9–10.3)
Chloride: 96 mmol/L — ABNORMAL LOW (ref 101–111)
GFR calc Af Amer: 52 mL/min — ABNORMAL LOW (ref >60)
GFR, EST NON AFRICAN AMERICAN: 45 mL/min — AB (ref >60)
GLUCOSE: 288 mg/dL — AB (ref 65–99)
POTASSIUM: 4.3 mmol/L (ref 3.5–5.1)
SODIUM: 133 mmol/L — AB (ref 135–145)

## 2015-01-23 LAB — URINALYSIS, ROUTINE W REFLEX MICROSCOPIC
Bilirubin Urine: NEGATIVE
Glucose, UA: 100 mg/dL — AB
KETONES UR: NEGATIVE mg/dL
Nitrite: NEGATIVE
Protein, ur: NEGATIVE mg/dL
SPECIFIC GRAVITY, URINE: 1.011 (ref 1.005–1.030)
UROBILINOGEN UA: 1 mg/dL (ref 0.0–1.0)
pH: 6 (ref 5.0–8.0)

## 2015-01-23 LAB — CBC
HCT: 30.7 % — ABNORMAL LOW (ref 39.0–52.0)
Hemoglobin: 10.1 g/dL — ABNORMAL LOW (ref 13.0–17.0)
MCH: 29.9 pg (ref 26.0–34.0)
MCHC: 32.9 g/dL (ref 30.0–36.0)
MCV: 90.8 fL (ref 78.0–100.0)
Platelets: 325 10*3/uL (ref 150–400)
RBC: 3.38 MIL/uL — ABNORMAL LOW (ref 4.22–5.81)
RDW: 14.3 % (ref 11.5–15.5)
WBC: 11.4 10*3/uL — ABNORMAL HIGH (ref 4.0–10.5)

## 2015-01-23 LAB — URINE MICROSCOPIC-ADD ON

## 2015-01-23 NOTE — ED Notes (Signed)
Per EMS, pt from home, reports hyperglycemia of 379.  No hx of DM.  Reports BG has been elevated with no official dx.  Pt takes kenalog.  Pt reports dizziness.  Pt is A&Ox 4.

## 2015-01-23 NOTE — ED Notes (Signed)
Bed: WA01 Expected date:  Expected time:  Means of arrival:  Comments: EMS/BG-379/no DM hx

## 2015-01-23 NOTE — ED Provider Notes (Signed)
CSN: [id number]     Arrival date & time 01/23/15  1634 History   First MD Initiated Contact with Patient 01/23/15 1742     Chief Complaint  Patient presents with  . Hyperglycemia    HPI   74 year old male presents today with hyperglycemia. Patient reports long-standing history of untreated hyperglycemia, reports recently saw his primary care provider three days ago who started him on insulin therapy, started him on antibiotics for potential UTI, and mentioned that his kidney function had worsened from previous labs. Patient was set up for to have nurse come and administer insulin at home, over the last 2 days the nurses not been able to calm due to patient not having his insulin. Both the patient and his daughter are uncertain where the prescription for the insulin is, reports close contact with home health that we'll be able to administer. Patient reports episode of weakness and dizziness earlier today, increased thirst, increased frequency of urinations. Patient was seen by primary care recently who had ordered CT of the abdomen and pelvis for further evaluation of hematuria. At time of evaluation patient has no complaints, denies dizziness, chest pain, headache, shortness of breath, abdominal pain, lotion swelling or edema.  Past Medical History  Diagnosis Date  . CAD (coronary artery disease) 07/30/2012    Inferior MI 2001 Stent to LAD and circ 2001 Refused cath 2008 in [place] Ischemic cardiomyopathy   . Ischemic cardiomyopathy 07/30/2012    Present since 2001 EKF 20% by ECHO 12/02/11   . Atrial fibrillation     Diagnosed 2008, Tried Pradaxa and Xarelto that caused hematuria Refuses warfarin   . Hypertensive heart disease without CHF   . Left atrial thrombus     He developed atrial fibrillation 2008 and was found to have a mass in his atrium on a CT scan. Confirmed by TEE that also showed spontaneous contrast.  He was placed on Pradaxa and had 2 TEE's  that eventually showed some reduction  in the size of his thrombus but he refused to have a third TEE. He was taken off of Pradaxa and later placed on Xarelto but developed hematuria on Xarelto and quit taking this. He refused to consider going on warfarin and was willing to take the risks of stroke being on just aspirin alone   . Hyperlipidemia   . Herpes simplex of eye   . AICD (automatic cardioverter/defibrillator) present     Implant 08/02/12 Dr. Caryl Comes RV lead St. Jude 7122 Q  serial #BPA [id number].  [place] CD  [phone] Q , serial [id number]   . Diabetes mellitus type 2, noninsulin dependent    Past Surgical History  Procedure Laterality Date  . Coronary angioplasty with stent placement  2001  . Anal fissurectomy    . Corneal transplant    . Retinal detachment surgery    . Cardiac defibrillator placement  08/02/12    St. Jude device  . Left heart catheterization with coronary angiogram N/A 08/01/2012    Procedure: LEFT HEART CATHETERIZATION WITH CORONARY ANGIOGRAM;  Surgeon: Jacolyn Reedy, MD;  Location[facility] LAB;  Service: Cardiovascular;  Laterality: N/A;  . Implantable cardioverter defibrillator implant N/A 08/02/2012    Procedure: IMPLANTABLE CARDIOVERTER DEFIBRILLATOR IMPLANT;  Surgeon: Deboraha Sprang, MD;  Location[facility] C[facility] LAB;  Service: Cardiovascular;  Laterality: N/A;   No family history on file. History  Substance Use Topics  . Smoking status: Current Some Day Smoker -- 1.00 packs/day for 50 years  Types: Cigarettes  . Smokeless tobacco: Not on file  . Alcohol Use: 1.5 oz/week    3 drink(s) per week    Review of Systems  All other systems reviewed and are negative.   Allergies  Lopid; Pradaxa; and Xarelto  Home Medications   Prior to Admission medications   Medication Sig Start Date End Date Taking? Authorizing Provider  apixaban (ELIQUIS) 5 MG TABS tablet Take 1 tablet (5 mg total) by mouth 2 (two) times daily. 01/18/13  Yes Deboraha Sprang, MD  ciprofloxacin (CIPRO) 250 MG tablet Take 250  mg by mouth every 12 (twelve) hours. ABT Start Date 01/21/15 & End Date 02/03/15.   Yes Historical Provider, MD  digoxin (LANOXIN) 0.125 MG tablet Take 1 tablet (0.125 mg total) by mouth daily. 01/18/13  Yes Deboraha Sprang, MD  furosemide (LASIX) 40 MG tablet Take 40 mg by mouth daily.   Yes Historical Provider, MD  lisinopril (PRINIVIL,ZESTRIL) 2.5 MG tablet Take 1 tablet (2.5 mg total) by mouth daily. 01/18/13  Yes Deboraha Sprang, MD  metoprolol succinate (TOPROL-XL) 50 MG 24 hr tablet Take 1 tablet (50 mg total) by mouth 2 (two) times daily. Take with or immediately following a meal. 01/18/13  Yes Deboraha Sprang, MD  nitroGLYCERIN (NITROSTAT) 0.4 MG SL tablet Place 1 tablet (0.4 mg total) under the tongue every 5 (five) minutes x 3 doses as needed for chest pain. 08/03/12   Jacolyn Reedy, MD   BP 116/73 mmHg  Pulse 93  Temp(Src) 97.6 F (36.4 C) (Oral)  Resp 27  SpO2 95%   Physical Exam  Constitutional: He is oriented to person, place, and time. He appears well-developed and well-nourished.  HENT:  Head: Normocephalic and atraumatic.  Eyes: Conjunctivae are normal. Pupils are equal, round, and reactive to light. Right eye exhibits no discharge. Left eye exhibits no discharge. No scleral icterus.  Neck: Normal range of motion. No JVD present. No tracheal deviation present.  Cardiovascular: Normal rate, regular rhythm, normal heart sounds and intact distal pulses.  Exam reveals no gallop and no friction rub.   No murmur heard. Pulmonary/Chest: Effort normal and breath sounds normal. No stridor. No respiratory distress. He has no wheezes. He has no rales. He exhibits no tenderness.  Abdominal: Soft. He exhibits no mass. There is no tenderness. There is no rebound and no guarding.  Neurological: He is alert and oriented to person, place, and time. He has normal strength. No cranial nerve deficit or sensory deficit. Coordination normal. GCS eye subscore is 4. GCS verbal subscore is 5. GCS motor  subscore is 6.  Skin: Skin is warm and dry.  Psychiatric: He has a normal mood and affect. His behavior is normal. Judgment and thought content normal.  Nursing note and vitals reviewed.   ED Course  Procedures (including critical care time) Labs Review Labs Reviewed  BASIC METABOLIC PANEL - Abnormal; Notable for the following:    Sodium 133 (*)    Chloride 96 (*)    Glucose, Bld 288 (*)    BUN 41 (*)    Creatinine, Ser 1.46 (*)    Calcium 8.2 (*)    GFR calc non Af Amer 45 (*)    GFR calc Af Amer 52 (*)    All other components within normal limits  CBC - Abnormal; Notable for the following:    WBC 11.4 (*)    RBC 3.38 (*)    Hemoglobin 10.1 (*)    HCT 30.7 (*)  All other components within normal limits  URINALYSIS, ROUTINE W REFLEX MICROSCOPIC (NOT AT [facility]) - Abnormal; Notable for the following:    APPearance CLOUDY (*)    Glucose, UA 100 (*)    Hgb urine dipstick LARGE (*)    Leukocytes, UA MODERATE (*)    All other components within normal limits  CBG MONITORING, ED - Abnormal; Notable for the following:    Glucose-Capillary 292 (*)    All other components within normal limits  CBG MONITORING, ED - Abnormal; Notable for the following:    Glucose-Capillary 244 (*)    All other components within normal limits  URINE MICROSCOPIC-ADD ON    Imaging Review Ct Abdomen Pelvis Wo Contrast  01/22/2015   CLINICAL DATA:  74 year old diabetic male presenting with 20 lb weight loss over 9 months. Micro hematuria. Initial encounter.  EXAM: CT ABDOMEN AND PELVIS WITHOUT CONTRAST  TECHNIQUE: Multidetector CT imaging of the abdomen and pelvis was performed following the standard protocol without IV contrast.  COMPARISON:  None.  FINDINGS: Minimal atelectasis/ scarring lung bases.  Pacemaker in place with tip in the region of the right ventricle. Coronary artery calcification. Mitral valve calcification. Cardiomegaly.  Atherosclerotic type changes of all visualize arterial structures.  Displaced calcifications within the abdominal aorta may reflect changes of chronic dissection and/or calcified thrombus. Maximal diameter of the aorta 2.5 cm. No infiltration of surrounding fat planes to suggest acute dissection.  Ectatic common iliac arteries measure up to 1.5 cm bilaterally. Ectatic splenic artery without focal aneurysm.  Splenomegaly with the spleen spanning over 15.4 cm.  Taking into account limitation by non contrast imaging, no worrisome focal splenic, hepatic, pancreatic, adrenal or renal lesion. No renal or ureteral obstructing stone.  Noncontrast filled views of the urinary bladder without obvious abnormality.  Prominent sigmoid diverticula and muscular hypertrophy. No extra luminal bowel inflammatory process, free fluid or free air.  Increased number of normal size retroperitoneal lymph nodes.  Degenerative changes lower thoracic and lumbar spine without osseous destructive lesion.  Fatty containing inguinal hernias larger on the right.  Prostate gland top-normal in size and minimally lobulated.  IMPRESSION: Prominent sigmoid diverticula and muscular hypertrophy. No extra luminal bowel inflammatory process, free fluid or free air.  No renal or ureteral obstructing stone.  Splenomegaly with spleen spanning or 15.4 cm.  Increased number of normal size retroperitoneal lymph nodes.  Pacemaker in place. Coronary artery calcification. Mitral valve calcification. Cardiomegaly.  Atherosclerotic type changes of all visualize arterial structures. Displaced calcifications within the abdominal aorta may reflect changes of chronic dissection and/or calcified thrombus. Maximal diameter of the aorta 2.5 cm. No infiltration of surrounding fat planes to suggest acute dissection.  Ectatic common iliac arteries measure up to 1.5 cm bilaterally. Ectatic splenic artery without focal aneurysm.   Electronically Signed   By: Genia Del M.D.   On: 01/22/2015 16:30     EKG Interpretation None      MDM    Final diagnoses:  Hyperglycemia  Decreased renal function    Labs: BMP, CBC, CBG, Urinalysis- significant for glucose of 288, creatinine 1.46, BUN at 41, hemoglobin 10.1  Imaging:  Consults:  Therapeutics: Normal saline  Assessment:   Plan: Patient presents with hyperglycemia, elevated creatinine. Patient was recently prescribed insulin, was uncertain where was at, I instructed patient to contact his pharmacy which indeed did have the prescription, but did not have it in stock. He transferred the prescription to another pharmacy that has this. [name] blood sugars 288 today,  no other significant findings on labs that would indicate further evaluation or management here in the ED. His creatinine is 1.46, most recent was 2014 with [place], he is well aware of elevated creatinine as he saw his primary care provider this week who would mention it. CT scan results were read patient, he is informed to follow-up with his primary care provider tomorrow morning for further evaluation and management and discussion of CT scan results. Strict return precautions, verbalized understanding and agreement to today's plan and had no further questions and concerns. Patient remained stable throughout his stay in the ED, no complaints.      Okey Regal, PA-C 01/23/15 2008  Noemi Chapel, MD 01/24/15 [phone]

## 2015-01-23 NOTE — Discharge Instructions (Signed)
Blood Glucose Monitoring °Monitoring your blood glucose (also know as blood sugar) helps you to manage your diabetes. It also helps you and your health care provider monitor your diabetes and determine how well your treatment plan is working. °WHY SHOULD YOU MONITOR YOUR BLOOD GLUCOSE? °· It can help you understand how food, exercise, and medicine affect your blood glucose. °· It allows you to know what your blood glucose is at any given moment. You can quickly tell if you are having low blood glucose (hypoglycemia) or high blood glucose (hyperglycemia). °· It can help you and your health care provider know how to adjust your medicines. °· It can help you understand how to manage an illness or adjust medicine for exercise. °WHEN SHOULD YOU TEST? °Your health care provider will help you decide how often you should check your blood glucose. This may depend on the type of diabetes you have, your diabetes control, or the types of medicines you are taking. Be sure to write down all of your blood glucose readings so that this information can be reviewed with your health care provider. See below for examples of testing times that your health care provider may suggest. °Type 1 Diabetes °· Test 4 times a day if you are in good control, using an insulin pump, or perform multiple daily injections. °· If your diabetes is not well controlled or if you are sick, you may need to monitor more often. °· It is a good idea to also monitor: °¨ Before and after exercise. °¨ Between meals and 2 hours after a meal. °¨ Occasionally between 2:00 a.m. and 3:00 a.m. °Type 2 Diabetes °· It can vary with each person, but generally, if you are on insulin, test 4 times a day. °· If you take medicines by mouth (orally), test 2 times a day. °· If you are on a controlled diet, test once a day. °· If your diabetes is not well controlled or if you are sick, you may need to monitor more often. °HOW TO MONITOR YOUR BLOOD GLUCOSE °Supplies  Needed °· Blood glucose meter. °· Test strips for your meter. Each meter has its own strips. You must use the strips that go with your own meter. °· A pricking needle (lancet). °· A device that holds the lancet (lancing device). °· A journal or log book to write down your results. °Procedure °· Wash your hands with soap and water. Alcohol is not preferred. °· Prick the side of your finger (not the tip) with the lancet. °· Gently milk the finger until a small drop of blood appears. °· Follow the instructions that come with your meter for inserting the test strip, applying blood to the strip, and using your blood glucose meter. °Other Areas to Get Blood for Testing °Some meters allow you to use other areas of your body (other than your finger) to test your blood. These areas are called alternative sites. The most common alternative sites are: °· The forearm. °· The thigh. °· The back area of the lower leg. °· The palm of the hand. °The blood flow in these areas is slower. Therefore, the blood glucose values you get may be delayed, and the numbers are different from what you would get from your fingers. Do not use alternative sites if you think you are having hypoglycemia. Your reading will not be accurate. Always use a finger if you are having hypoglycemia. Also, if you cannot feel your lows (hypoglycemia unawareness), always use your fingers for your   blood glucose checks. ADDITIONAL TIPS FOR GLUCOSE MONITORING  Do not reuse lancets.  Always carry your supplies with you.  All blood glucose meters have a 24-hour "hotline" number to call if you have questions or need help.  Adjust (calibrate) your blood glucose meter with a control solution after finishing a few boxes of strips. BLOOD GLUCOSE RECORD KEEPING It is a good idea to keep a daily record or log of your blood glucose readings. Most glucose meters, if not all, keep your glucose records stored in the meter. Some meters come with the ability to download  your records to your home computer. Keeping a record of your blood glucose readings is especially helpful if you are wanting to look for patterns. Make notes to go along with the blood glucose readings because you might forget what happened at that exact time. Keeping good records helps you and your health care provider to work together to achieve good diabetes management.  Document Released: 07/02/2003 Document Revised: 11/13/2013 Document Reviewed: 11/21/2012 [facility] Patient Information 2015 [place], [place]. This information is not intended to replace advice given to you by your health care provider. Make sure you discuss any questions you have with your health care provider.   Please fill your insulin prescription and begin taking as directed by your primary care provider. Please contact her primary care provider inform them of your visit today and all relevant information including high blood sugar, increased creatinine, and CT scan results. Please return immediately if worsening signs or symptoms present.

## 2015-01-25 DIAGNOSIS — I480 Paroxysmal atrial fibrillation: Secondary | ICD-10-CM | POA: Diagnosis not present

## 2015-01-25 DIAGNOSIS — N183 Chronic kidney disease, stage 3 (moderate): Secondary | ICD-10-CM | POA: Diagnosis not present

## 2015-01-25 DIAGNOSIS — E1165 Type 2 diabetes mellitus with hyperglycemia: Secondary | ICD-10-CM | POA: Diagnosis not present

## 2015-01-25 DIAGNOSIS — Z794 Long term (current) use of insulin: Secondary | ICD-10-CM | POA: Diagnosis not present

## 2015-01-25 DIAGNOSIS — E1122 Type 2 diabetes mellitus with diabetic chronic kidney disease: Secondary | ICD-10-CM | POA: Diagnosis not present

## 2015-01-25 DIAGNOSIS — Z7901 Long term (current) use of anticoagulants: Secondary | ICD-10-CM | POA: Diagnosis not present

## 2015-01-25 DIAGNOSIS — M6281 Muscle weakness (generalized): Secondary | ICD-10-CM | POA: Diagnosis not present

## 2015-01-25 DIAGNOSIS — I129 Hypertensive chronic kidney disease with stage 1 through stage 4 chronic kidney disease, or unspecified chronic kidney disease: Secondary | ICD-10-CM | POA: Diagnosis not present

## 2015-01-25 DIAGNOSIS — I509 Heart failure, unspecified: Secondary | ICD-10-CM | POA: Diagnosis not present

## 2015-01-25 DIAGNOSIS — Z95810 Presence of automatic (implantable) cardiac defibrillator: Secondary | ICD-10-CM | POA: Diagnosis not present

## 2015-01-25 DIAGNOSIS — F17210 Nicotine dependence, cigarettes, uncomplicated: Secondary | ICD-10-CM | POA: Diagnosis not present

## 2015-01-25 DIAGNOSIS — E46 Unspecified protein-calorie malnutrition: Secondary | ICD-10-CM | POA: Diagnosis not present

## 2015-01-25 DIAGNOSIS — R634 Abnormal weight loss: Secondary | ICD-10-CM | POA: Diagnosis not present

## 2015-01-28 DIAGNOSIS — E1165 Type 2 diabetes mellitus with hyperglycemia: Secondary | ICD-10-CM | POA: Diagnosis not present

## 2015-01-28 DIAGNOSIS — N183 Chronic kidney disease, stage 3 (moderate): Secondary | ICD-10-CM | POA: Diagnosis not present

## 2015-01-28 DIAGNOSIS — E1122 Type 2 diabetes mellitus with diabetic chronic kidney disease: Secondary | ICD-10-CM | POA: Diagnosis not present

## 2015-01-28 DIAGNOSIS — E46 Unspecified protein-calorie malnutrition: Secondary | ICD-10-CM | POA: Diagnosis not present

## 2015-01-28 DIAGNOSIS — I129 Hypertensive chronic kidney disease with stage 1 through stage 4 chronic kidney disease, or unspecified chronic kidney disease: Secondary | ICD-10-CM | POA: Diagnosis not present

## 2015-01-28 DIAGNOSIS — I480 Paroxysmal atrial fibrillation: Secondary | ICD-10-CM | POA: Diagnosis not present

## 2015-01-29 DIAGNOSIS — E1122 Type 2 diabetes mellitus with diabetic chronic kidney disease: Secondary | ICD-10-CM | POA: Diagnosis not present

## 2015-01-29 DIAGNOSIS — E1165 Type 2 diabetes mellitus with hyperglycemia: Secondary | ICD-10-CM | POA: Diagnosis not present

## 2015-01-29 DIAGNOSIS — I129 Hypertensive chronic kidney disease with stage 1 through stage 4 chronic kidney disease, or unspecified chronic kidney disease: Secondary | ICD-10-CM | POA: Diagnosis not present

## 2015-01-29 DIAGNOSIS — I480 Paroxysmal atrial fibrillation: Secondary | ICD-10-CM | POA: Diagnosis not present

## 2015-01-29 DIAGNOSIS — E46 Unspecified protein-calorie malnutrition: Secondary | ICD-10-CM | POA: Diagnosis not present

## 2015-01-29 DIAGNOSIS — N183 Chronic kidney disease, stage 3 (moderate): Secondary | ICD-10-CM | POA: Diagnosis not present

## 2015-01-30 DIAGNOSIS — E1122 Type 2 diabetes mellitus with diabetic chronic kidney disease: Secondary | ICD-10-CM | POA: Diagnosis not present

## 2015-01-30 DIAGNOSIS — I480 Paroxysmal atrial fibrillation: Secondary | ICD-10-CM | POA: Diagnosis not present

## 2015-01-30 DIAGNOSIS — N183 Chronic kidney disease, stage 3 (moderate): Secondary | ICD-10-CM | POA: Diagnosis not present

## 2015-01-30 DIAGNOSIS — I129 Hypertensive chronic kidney disease with stage 1 through stage 4 chronic kidney disease, or unspecified chronic kidney disease: Secondary | ICD-10-CM | POA: Diagnosis not present

## 2015-01-30 DIAGNOSIS — E1165 Type 2 diabetes mellitus with hyperglycemia: Secondary | ICD-10-CM | POA: Diagnosis not present

## 2015-01-30 DIAGNOSIS — E46 Unspecified protein-calorie malnutrition: Secondary | ICD-10-CM | POA: Diagnosis not present

## 2015-01-31 ENCOUNTER — Emergency Department (HOSPITAL_COMMUNITY)
Admission: EM | Admit: 2015-01-31 | Discharge: 2015-01-31 | Disposition: A | Discharge status: Home / Self Care | Payer: Medicare Other | Attending: Emergency Medicine | Admitting: Emergency Medicine

## 2015-01-31 ENCOUNTER — Encounter (HOSPITAL_COMMUNITY): Payer: Self-pay | Admitting: *Deleted

## 2015-01-31 ENCOUNTER — Emergency Department (HOSPITAL_COMMUNITY): Payer: Medicare Other

## 2015-01-31 DIAGNOSIS — R590 Localized enlarged lymph nodes: Secondary | ICD-10-CM | POA: Diagnosis not present

## 2015-01-31 DIAGNOSIS — I480 Paroxysmal atrial fibrillation: Secondary | ICD-10-CM | POA: Diagnosis not present

## 2015-01-31 DIAGNOSIS — R5383 Other fatigue: Secondary | ICD-10-CM | POA: Diagnosis not present

## 2015-01-31 DIAGNOSIS — Z8619 Personal history of other infectious and parasitic diseases: Secondary | ICD-10-CM | POA: Diagnosis not present

## 2015-01-31 DIAGNOSIS — R531 Weakness: Secondary | ICD-10-CM | POA: Diagnosis not present

## 2015-01-31 DIAGNOSIS — Z720 Tobacco use: Secondary | ICD-10-CM | POA: Insufficient documentation

## 2015-01-31 DIAGNOSIS — I2510 Atherosclerotic heart disease of native coronary artery without angina pectoris: Secondary | ICD-10-CM | POA: Insufficient documentation

## 2015-01-31 DIAGNOSIS — Z9861 Coronary angioplasty status: Secondary | ICD-10-CM | POA: Insufficient documentation

## 2015-01-31 DIAGNOSIS — N183 Chronic kidney disease, stage 3 (moderate): Secondary | ICD-10-CM | POA: Diagnosis not present

## 2015-01-31 DIAGNOSIS — I509 Heart failure, unspecified: Secondary | ICD-10-CM | POA: Diagnosis not present

## 2015-01-31 DIAGNOSIS — I129 Hypertensive chronic kidney disease with stage 1 through stage 4 chronic kidney disease, or unspecified chronic kidney disease: Secondary | ICD-10-CM | POA: Diagnosis not present

## 2015-01-31 DIAGNOSIS — Z79899 Other long term (current) drug therapy: Secondary | ICD-10-CM | POA: Diagnosis not present

## 2015-01-31 DIAGNOSIS — R630 Anorexia: Secondary | ICD-10-CM | POA: Insufficient documentation

## 2015-01-31 DIAGNOSIS — Z95810 Presence of automatic (implantable) cardiac defibrillator: Secondary | ICD-10-CM | POA: Diagnosis not present

## 2015-01-31 DIAGNOSIS — Z7901 Long term (current) use of anticoagulants: Secondary | ICD-10-CM | POA: Diagnosis not present

## 2015-01-31 DIAGNOSIS — R42 Dizziness and giddiness: Secondary | ICD-10-CM | POA: Insufficient documentation

## 2015-01-31 DIAGNOSIS — I4891 Unspecified atrial fibrillation: Secondary | ICD-10-CM | POA: Diagnosis not present

## 2015-01-31 DIAGNOSIS — R0602 Shortness of breath: Secondary | ICD-10-CM | POA: Diagnosis not present

## 2015-01-31 DIAGNOSIS — E119 Type 2 diabetes mellitus without complications: Secondary | ICD-10-CM | POA: Insufficient documentation

## 2015-01-31 DIAGNOSIS — I119 Hypertensive heart disease without heart failure: Secondary | ICD-10-CM | POA: Diagnosis not present

## 2015-01-31 DIAGNOSIS — E1122 Type 2 diabetes mellitus with diabetic chronic kidney disease: Secondary | ICD-10-CM | POA: Diagnosis not present

## 2015-01-31 DIAGNOSIS — E46 Unspecified protein-calorie malnutrition: Secondary | ICD-10-CM | POA: Diagnosis not present

## 2015-01-31 DIAGNOSIS — R161 Splenomegaly, not elsewhere classified: Secondary | ICD-10-CM | POA: Diagnosis not present

## 2015-01-31 DIAGNOSIS — N390 Urinary tract infection, site not specified: Secondary | ICD-10-CM | POA: Insufficient documentation

## 2015-01-31 DIAGNOSIS — D649 Anemia, unspecified: Secondary | ICD-10-CM | POA: Diagnosis not present

## 2015-01-31 DIAGNOSIS — Z794 Long term (current) use of insulin: Secondary | ICD-10-CM | POA: Diagnosis not present

## 2015-01-31 DIAGNOSIS — Z9889 Other specified postprocedural states: Secondary | ICD-10-CM | POA: Diagnosis not present

## 2015-01-31 DIAGNOSIS — D72829 Elevated white blood cell count, unspecified: Secondary | ICD-10-CM | POA: Diagnosis not present

## 2015-01-31 DIAGNOSIS — E1165 Type 2 diabetes mellitus with hyperglycemia: Secondary | ICD-10-CM | POA: Diagnosis not present

## 2015-01-31 LAB — URINALYSIS, ROUTINE W REFLEX MICROSCOPIC
Bilirubin Urine: NEGATIVE
Glucose, UA: NEGATIVE mg/dL
Ketones, ur: NEGATIVE mg/dL
Nitrite: NEGATIVE
Protein, ur: 30 mg/dL — AB
Specific Gravity, Urine: 1.01 (ref 1.005–1.030)
Urobilinogen, UA: 0.2 mg/dL (ref 0.0–1.0)
pH: 5.5 (ref 5.0–8.0)

## 2015-01-31 LAB — DIGOXIN LEVEL: Digoxin Level: 0.2 ng/mL — ABNORMAL LOW (ref 0.8–2.0)

## 2015-01-31 LAB — BASIC METABOLIC PANEL
ANION GAP: 10 (ref 5–15)
BUN: 28 mg/dL — AB (ref 6–20)
CALCIUM: 9 mg/dL (ref 8.9–10.3)
CO2: 27 mmol/L (ref 22–32)
Chloride: 98 mmol/L — ABNORMAL LOW (ref 101–111)
Creatinine, Ser: 1.77 mg/dL — ABNORMAL HIGH (ref 0.61–1.24)
GFR calc Af Amer: 42 mL/min — ABNORMAL LOW (ref >60)
GFR, EST NON AFRICAN AMERICAN: 36 mL/min — AB (ref >60)
GLUCOSE: 135 mg/dL — AB (ref 65–99)
POTASSIUM: 3.8 mmol/L (ref 3.5–5.1)
SODIUM: 135 mmol/L (ref 135–145)

## 2015-01-31 LAB — CBC
HCT: 37.4 % — ABNORMAL LOW (ref 39.0–52.0)
HEMOGLOBIN: 12.5 g/dL — AB (ref 13.0–17.0)
MCH: 30.2 pg (ref 26.0–34.0)
MCHC: 33.4 g/dL (ref 30.0–36.0)
MCV: 90.3 fL (ref 78.0–100.0)
Platelets: 310 10*3/uL (ref 150–400)
RBC: 4.14 MIL/uL — AB (ref 4.22–5.81)
RDW: 15 % (ref 11.5–15.5)
WBC: 10.2 10*3/uL (ref 4.0–10.5)

## 2015-01-31 LAB — HEPATIC FUNCTION PANEL
ALBUMIN: 3.1 g/dL — AB (ref 3.5–5.0)
ALK PHOS: 75 U/L (ref 38–126)
ALT: 23 U/L (ref 17–63)
AST: 23 U/L (ref 15–41)
Bilirubin, Direct: 0.3 mg/dL (ref 0.1–0.5)
Indirect Bilirubin: 0.7 mg/dL (ref 0.3–0.9)
Total Bilirubin: 1 mg/dL (ref 0.3–1.2)
Total Protein: 6.8 g/dL (ref 6.5–8.1)

## 2015-01-31 LAB — URINE MICROSCOPIC-ADD ON

## 2015-01-31 LAB — I-STAT TROPONIN, ED: Troponin i, poc: 0.02 ng/mL (ref 0.00–0.08)

## 2015-01-31 NOTE — ED Notes (Addendum)
Pt was seen by PCP today for c/o increased fatigue, had EKG done, EKG showed changes, pt was sent here for further evaluation. Pt denies chest pain, shortness of breath, n/v

## 2015-01-31 NOTE — ED Provider Notes (Signed)
CSN: [id number]     Arrival date & time 01/31/15  1811 History   First MD Initiated Contact with Patient 01/31/15 1907     Chief Complaint  Patient presents with  . Fatigue     (Consider location/radiation/quality/duration/timing/severity/associated sxs/prior Treatment) The history is provided by the patient and a relative.   patient was sent in from his PCP for fatigue. Reported EKG changes. Patient states he is been doing somewhat worse over the last 3 weeks. Been treated for UTI. He's lost 30 pounds over the last 9 months. No chest pain. He has some swelling in his legs. He's been treated for UTI recently. States he has been able to do much less physical activity than normally. States he just begins to feel weak. States having appointment with a hematologist. He has chronic atrial fibrillation  Past Medical History  Diagnosis Date  . CAD (coronary artery disease) 07/30/2012    Inferior MI 2001 Stent to LAD and circ 2001 Refused cath 2008 in [place] Ischemic cardiomyopathy   . Ischemic cardiomyopathy 07/30/2012    Present since 2001 EKF 20% by ECHO 12/02/11   . Atrial fibrillation     Diagnosed 2008, Tried Pradaxa and Xarelto that caused hematuria Refuses warfarin   . Hypertensive heart disease without CHF   . Left atrial thrombus     He developed atrial fibrillation 2008 and was found to have a mass in his atrium on a CT scan. Confirmed by TEE that also showed spontaneous contrast.  He was placed on Pradaxa and had 2 TEE's  that eventually showed some reduction in the size of his thrombus but he refused to have a third TEE. He was taken off of Pradaxa and later placed on Xarelto but developed hematuria on Xarelto and quit taking this. He refused to consider going on warfarin and was willing to take the risks of stroke being on just aspirin alone   . Hyperlipidemia   . Herpes simplex of eye   . AICD (automatic cardioverter/defibrillator) present     Implant 08/02/12 Dr. Caryl Comes RV lead [employer] 7122 Q  serial #[id number].  [place] CD  [phone] Q , serial [id number]   . Diabetes mellitus type 2, noninsulin dependent    Past Surgical History  Procedure Laterality Date  . Coronary angioplasty with stent placement  2001  . Anal fissurectomy    . Corneal transplant    . Retinal detachment surgery    . Cardiac defibrillator placement  08/02/12    [employer] Jude device  . Left heart catheterization with coronary angiogram N/A 08/01/2012    Procedure: LEFT HEART CATHETERIZATION WITH CORONARY ANGIOGRAM;  Surgeon: Jacolyn Reedy, MD;  Location: [facility] CATH LAB;  Service: Cardiovascular;  Laterality: N/A;  . Implantable cardioverter defibrillator implant N/A 08/02/2012    Procedure: IMPLANTABLE CARDIOVERTER DEFIBRILLATOR IMPLANT;  Surgeon: Deboraha Sprang, MD;  Location: [facility] C[facility] LAB;  Service: Cardiovascular;  Laterality: N/A;   History reviewed. No pertinent family history. History  Substance Use Topics  . Smoking status: Current Some Day Smoker -- 1.00 packs/day for 50 years    Types: Cigarettes  . Smokeless tobacco: Not on file  . Alcohol Use: 1.5 oz/week    3 drink(s) per week    Review of Systems  Constitutional: Positive for appetite change, fatigue and unexpected weight change.  HENT: Negative for dental problem.   Respiratory: Negative for chest tightness.   Cardiovascular: Negative for chest pain and leg swelling.  Gastrointestinal: Negative for abdominal pain.  Genitourinary: Positive for hematuria.  Musculoskeletal: Negative for back pain.  Skin: Negative for pallor and rash.  Neurological: Positive for light-headedness. Negative for headaches.  Psychiatric/Behavioral: Negative for agitation.      Allergies  Lopid; Pradaxa; and Xarelto  Home Medications   Prior to Admission medications   Medication Sig Start Date End Date Taking? Authorizing Provider  apixaban (ELIQUIS) 5 MG TABS tablet Take 1 tablet (5 mg total) by mouth 2 (two) times daily. 01/18/13  Yes  Deboraha Sprang, MD  ciprofloxacin (CIPRO) 250 MG tablet Take 250 mg by mouth every 12 (twelve) hours. ABT Start Date 01/21/15 & End Date 02/03/15.   Yes Historical Provider, MD  digoxin (LANOXIN) 0.125 MG tablet Take 1 tablet (0.125 mg total) by mouth daily. 01/18/13  Yes Deboraha Sprang, MD  furosemide (LASIX) 40 MG tablet Take 40 mg by mouth daily.   Yes Historical Provider, MD  insulin aspart (NOVOLOG) 100 UNIT/ML injection Inject 5-10 Units into the skin 2 (two) times daily. 10 units in the morning, 5 units in the evening   Yes Historical Provider, MD  lisinopril (PRINIVIL,ZESTRIL) 2.5 MG tablet Take 1 tablet (2.5 mg total) by mouth daily. 01/18/13  Yes Deboraha Sprang, MD  metoprolol succinate (TOPROL-XL) 50 MG 24 hr tablet Take 1 tablet (50 mg total) by mouth 2 (two) times daily. Take with or immediately following a meal. 01/18/13  Yes Deboraha Sprang, MD  nitroGLYCERIN (NITROSTAT) 0.4 MG SL tablet Place 1 tablet (0.4 mg total) under the tongue every 5 (five) minutes x 3 doses as needed for chest pain. 08/03/12  Yes Jacolyn Reedy, MD   BP 125/76 mmHg  Pulse 86  Temp(Src) 97.5 F (36.4 C) (Oral)  Resp 23  Ht 5\' 11"  (1.803 m)  Wt 164 lb (74.39 kg)  BMI 22.88 kg/m2  SpO2 98% Physical Exam  Constitutional: He appears well-developed and well-nourished.  HENT:  Head: Atraumatic.  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  Cardiovascular: Normal rate.   Pulmonary/Chest: Effort normal.  Abdominal: Soft. There is no tenderness.  Musculoskeletal: Normal range of motion.  Neurological: He is alert.  Skin: Skin is warm.  Psychiatric: He has a normal mood and affect.    ED Course  Procedures (including critical care time) Labs Review Labs Reviewed  BASIC METABOLIC PANEL - Abnormal; Notable for the following:    Chloride 98 (*)    Glucose, Bld 135 (*)    BUN 28 (*)    Creatinine, Ser 1.77 (*)    GFR calc non Af Amer 36 (*)    GFR calc Af Amer 42 (*)    All other components  within normal limits  CBC - Abnormal; Notable for the following:    RBC 4.14 (*)    Hemoglobin 12.5 (*)    HCT 37.4 (*)    All other components within normal limits  URINALYSIS, ROUTINE W REFLEX MICROSCOPIC (NOT AT [facility]) - Abnormal; Notable for the following:    APPearance CLOUDY (*)    Hgb urine dipstick LARGE (*)    Protein, ur 30 (*)    Leukocytes, UA MODERATE (*)    All other components within normal limits  HEPATIC FUNCTION PANEL - Abnormal; Notable for the following:    Albumin 3.1 (*)    All other components within normal limits  DIGOXIN LEVEL - Abnormal; Notable for the following:    Digoxin Level 0.2 (*)  All other components within normal limits  URINE MICROSCOPIC-ADD ON - Abnormal; Notable for the following:    Bacteria, UA FEW (*)    All other components within normal limits  URINE CULTURE  I-STAT TROPOININ, ED    Imaging Review Dg Chest 2 View  01/31/2015   CLINICAL DATA:  Weakness for 3 weeks  EXAM: CHEST  2 VIEW  COMPARISON:  01/15/2015  FINDINGS: Cardiomediastinal silhouette is stable. Single lead cardiac pacemaker is unchanged in position. No acute infiltrate or pleural effusion. No pulmonary edema. Bony thorax is unremarkable.  IMPRESSION: No active cardiopulmonary disease.   Electronically Signed   By: Lahoma Crocker M.D.   On: 01/31/2015 20:00     EKG Interpretation   Date/Time:  Thursday January 31 2015 18:18:21 EDT Ventricular Rate:  97 PR Interval:    QRS Duration: 120 QT Interval:  360 QTC Calculation: 457 R Axis:   94 Text Interpretation:  Atrial fibrillation with premature ventricular or  aberrantly conducted complexes Rightward axis Incomplete left bundle  branch block Nonspecific T wave abnormality Abnormal ECG Confirmed by  Alvino Chapel  MD, Ovid Curd [phone]) on 01/31/2015 7:08:30 PM      MDM   Final diagnoses:  Other fatigue  Lower urinary tract infection    Patient with fatigue. Generalized weakness. EKG stable. Lab work closely with his been.  Has possible UTI but is currently being treated. Has lost a significant amount of weight and there is worry for some occult malignancy. Not orthostatic here. Will discharge home to follow-up with his PCP.    Davonna Belling, MD 01/31/15 [phone]

## 2015-01-31 NOTE — Discharge Instructions (Signed)

## 2015-02-01 DIAGNOSIS — N183 Chronic kidney disease, stage 3 (moderate): Secondary | ICD-10-CM | POA: Diagnosis not present

## 2015-02-01 DIAGNOSIS — E46 Unspecified protein-calorie malnutrition: Secondary | ICD-10-CM | POA: Diagnosis not present

## 2015-02-01 DIAGNOSIS — E1122 Type 2 diabetes mellitus with diabetic chronic kidney disease: Secondary | ICD-10-CM | POA: Diagnosis not present

## 2015-02-01 DIAGNOSIS — I129 Hypertensive chronic kidney disease with stage 1 through stage 4 chronic kidney disease, or unspecified chronic kidney disease: Secondary | ICD-10-CM | POA: Diagnosis not present

## 2015-02-01 DIAGNOSIS — I480 Paroxysmal atrial fibrillation: Secondary | ICD-10-CM | POA: Diagnosis not present

## 2015-02-01 DIAGNOSIS — E1165 Type 2 diabetes mellitus with hyperglycemia: Secondary | ICD-10-CM | POA: Diagnosis not present

## 2015-02-03 LAB — URINE CULTURE

## 2015-02-04 ENCOUNTER — Ambulatory Visit: Payer: Medicare Other

## 2015-02-04 ENCOUNTER — Ambulatory Visit: Payer: Medicare Other | Admitting: Hematology and Oncology

## 2015-02-04 DIAGNOSIS — E1165 Type 2 diabetes mellitus with hyperglycemia: Secondary | ICD-10-CM | POA: Diagnosis not present

## 2015-02-04 DIAGNOSIS — N183 Chronic kidney disease, stage 3 (moderate): Secondary | ICD-10-CM | POA: Diagnosis not present

## 2015-02-04 DIAGNOSIS — I129 Hypertensive chronic kidney disease with stage 1 through stage 4 chronic kidney disease, or unspecified chronic kidney disease: Secondary | ICD-10-CM | POA: Diagnosis not present

## 2015-02-04 DIAGNOSIS — I480 Paroxysmal atrial fibrillation: Secondary | ICD-10-CM | POA: Diagnosis not present

## 2015-02-04 DIAGNOSIS — E46 Unspecified protein-calorie malnutrition: Secondary | ICD-10-CM | POA: Diagnosis not present

## 2015-02-04 DIAGNOSIS — E1122 Type 2 diabetes mellitus with diabetic chronic kidney disease: Secondary | ICD-10-CM | POA: Diagnosis not present

## 2015-02-05 DIAGNOSIS — I129 Hypertensive chronic kidney disease with stage 1 through stage 4 chronic kidney disease, or unspecified chronic kidney disease: Secondary | ICD-10-CM | POA: Diagnosis not present

## 2015-02-05 DIAGNOSIS — E1122 Type 2 diabetes mellitus with diabetic chronic kidney disease: Secondary | ICD-10-CM | POA: Diagnosis not present

## 2015-02-05 DIAGNOSIS — E46 Unspecified protein-calorie malnutrition: Secondary | ICD-10-CM | POA: Diagnosis not present

## 2015-02-05 DIAGNOSIS — I480 Paroxysmal atrial fibrillation: Secondary | ICD-10-CM | POA: Diagnosis not present

## 2015-02-05 DIAGNOSIS — E1165 Type 2 diabetes mellitus with hyperglycemia: Secondary | ICD-10-CM | POA: Diagnosis not present

## 2015-02-05 DIAGNOSIS — N183 Chronic kidney disease, stage 3 (moderate): Secondary | ICD-10-CM | POA: Diagnosis not present

## 2015-02-06 DIAGNOSIS — E1122 Type 2 diabetes mellitus with diabetic chronic kidney disease: Secondary | ICD-10-CM | POA: Diagnosis not present

## 2015-02-06 DIAGNOSIS — E1165 Type 2 diabetes mellitus with hyperglycemia: Secondary | ICD-10-CM | POA: Diagnosis not present

## 2015-02-06 DIAGNOSIS — N183 Chronic kidney disease, stage 3 (moderate): Secondary | ICD-10-CM | POA: Diagnosis not present

## 2015-02-06 DIAGNOSIS — I129 Hypertensive chronic kidney disease with stage 1 through stage 4 chronic kidney disease, or unspecified chronic kidney disease: Secondary | ICD-10-CM | POA: Diagnosis not present

## 2015-02-06 DIAGNOSIS — E46 Unspecified protein-calorie malnutrition: Secondary | ICD-10-CM | POA: Diagnosis not present

## 2015-02-06 DIAGNOSIS — I480 Paroxysmal atrial fibrillation: Secondary | ICD-10-CM | POA: Diagnosis not present

## 2015-02-07 DIAGNOSIS — E46 Unspecified protein-calorie malnutrition: Secondary | ICD-10-CM | POA: Diagnosis not present

## 2015-02-07 DIAGNOSIS — E1122 Type 2 diabetes mellitus with diabetic chronic kidney disease: Secondary | ICD-10-CM | POA: Diagnosis not present

## 2015-02-07 DIAGNOSIS — E1165 Type 2 diabetes mellitus with hyperglycemia: Secondary | ICD-10-CM | POA: Diagnosis not present

## 2015-02-07 DIAGNOSIS — N183 Chronic kidney disease, stage 3 (moderate): Secondary | ICD-10-CM | POA: Diagnosis not present

## 2015-02-07 DIAGNOSIS — I480 Paroxysmal atrial fibrillation: Secondary | ICD-10-CM | POA: Diagnosis not present

## 2015-02-07 DIAGNOSIS — I129 Hypertensive chronic kidney disease with stage 1 through stage 4 chronic kidney disease, or unspecified chronic kidney disease: Secondary | ICD-10-CM | POA: Diagnosis not present

## 2015-02-08 DIAGNOSIS — E46 Unspecified protein-calorie malnutrition: Secondary | ICD-10-CM | POA: Diagnosis not present

## 2015-02-08 DIAGNOSIS — I129 Hypertensive chronic kidney disease with stage 1 through stage 4 chronic kidney disease, or unspecified chronic kidney disease: Secondary | ICD-10-CM | POA: Diagnosis not present

## 2015-02-08 DIAGNOSIS — N183 Chronic kidney disease, stage 3 (moderate): Secondary | ICD-10-CM | POA: Diagnosis not present

## 2015-02-08 DIAGNOSIS — E1122 Type 2 diabetes mellitus with diabetic chronic kidney disease: Secondary | ICD-10-CM | POA: Diagnosis not present

## 2015-02-08 DIAGNOSIS — E1165 Type 2 diabetes mellitus with hyperglycemia: Secondary | ICD-10-CM | POA: Diagnosis not present

## 2015-02-08 DIAGNOSIS — I480 Paroxysmal atrial fibrillation: Secondary | ICD-10-CM | POA: Diagnosis not present

## 2015-02-11 DIAGNOSIS — E1122 Type 2 diabetes mellitus with diabetic chronic kidney disease: Secondary | ICD-10-CM | POA: Diagnosis not present

## 2015-02-11 DIAGNOSIS — I480 Paroxysmal atrial fibrillation: Secondary | ICD-10-CM | POA: Diagnosis not present

## 2015-02-11 DIAGNOSIS — E1165 Type 2 diabetes mellitus with hyperglycemia: Secondary | ICD-10-CM | POA: Diagnosis not present

## 2015-02-11 DIAGNOSIS — I129 Hypertensive chronic kidney disease with stage 1 through stage 4 chronic kidney disease, or unspecified chronic kidney disease: Secondary | ICD-10-CM | POA: Diagnosis not present

## 2015-02-11 DIAGNOSIS — N183 Chronic kidney disease, stage 3 (moderate): Secondary | ICD-10-CM | POA: Diagnosis not present

## 2015-02-11 DIAGNOSIS — E46 Unspecified protein-calorie malnutrition: Secondary | ICD-10-CM | POA: Diagnosis not present

## 2015-02-12 DIAGNOSIS — N183 Chronic kidney disease, stage 3 (moderate): Secondary | ICD-10-CM | POA: Diagnosis not present

## 2015-02-12 DIAGNOSIS — I129 Hypertensive chronic kidney disease with stage 1 through stage 4 chronic kidney disease, or unspecified chronic kidney disease: Secondary | ICD-10-CM | POA: Diagnosis not present

## 2015-02-12 DIAGNOSIS — E46 Unspecified protein-calorie malnutrition: Secondary | ICD-10-CM | POA: Diagnosis not present

## 2015-02-12 DIAGNOSIS — E1165 Type 2 diabetes mellitus with hyperglycemia: Secondary | ICD-10-CM | POA: Diagnosis not present

## 2015-02-12 DIAGNOSIS — E1122 Type 2 diabetes mellitus with diabetic chronic kidney disease: Secondary | ICD-10-CM | POA: Diagnosis not present

## 2015-02-12 DIAGNOSIS — I480 Paroxysmal atrial fibrillation: Secondary | ICD-10-CM | POA: Diagnosis not present

## 2015-02-13 DIAGNOSIS — E46 Unspecified protein-calorie malnutrition: Secondary | ICD-10-CM | POA: Diagnosis not present

## 2015-02-13 DIAGNOSIS — I480 Paroxysmal atrial fibrillation: Secondary | ICD-10-CM | POA: Diagnosis not present

## 2015-02-13 DIAGNOSIS — I129 Hypertensive chronic kidney disease with stage 1 through stage 4 chronic kidney disease, or unspecified chronic kidney disease: Secondary | ICD-10-CM | POA: Diagnosis not present

## 2015-02-13 DIAGNOSIS — E1122 Type 2 diabetes mellitus with diabetic chronic kidney disease: Secondary | ICD-10-CM | POA: Diagnosis not present

## 2015-02-13 DIAGNOSIS — E1165 Type 2 diabetes mellitus with hyperglycemia: Secondary | ICD-10-CM | POA: Diagnosis not present

## 2015-02-13 DIAGNOSIS — N183 Chronic kidney disease, stage 3 (moderate): Secondary | ICD-10-CM | POA: Diagnosis not present

## 2015-02-14 DIAGNOSIS — N183 Chronic kidney disease, stage 3 (moderate): Secondary | ICD-10-CM | POA: Diagnosis not present

## 2015-02-14 DIAGNOSIS — E1165 Type 2 diabetes mellitus with hyperglycemia: Secondary | ICD-10-CM | POA: Diagnosis not present

## 2015-02-14 DIAGNOSIS — E46 Unspecified protein-calorie malnutrition: Secondary | ICD-10-CM | POA: Diagnosis not present

## 2015-02-14 DIAGNOSIS — I129 Hypertensive chronic kidney disease with stage 1 through stage 4 chronic kidney disease, or unspecified chronic kidney disease: Secondary | ICD-10-CM | POA: Diagnosis not present

## 2015-02-14 DIAGNOSIS — E1122 Type 2 diabetes mellitus with diabetic chronic kidney disease: Secondary | ICD-10-CM | POA: Diagnosis not present

## 2015-02-14 DIAGNOSIS — I480 Paroxysmal atrial fibrillation: Secondary | ICD-10-CM | POA: Diagnosis not present

## 2015-02-19 DIAGNOSIS — E1165 Type 2 diabetes mellitus with hyperglycemia: Secondary | ICD-10-CM | POA: Diagnosis not present

## 2015-02-19 DIAGNOSIS — I480 Paroxysmal atrial fibrillation: Secondary | ICD-10-CM | POA: Diagnosis not present

## 2015-02-19 DIAGNOSIS — E46 Unspecified protein-calorie malnutrition: Secondary | ICD-10-CM | POA: Diagnosis not present

## 2015-02-19 DIAGNOSIS — N183 Chronic kidney disease, stage 3 (moderate): Secondary | ICD-10-CM | POA: Diagnosis not present

## 2015-02-19 DIAGNOSIS — I129 Hypertensive chronic kidney disease with stage 1 through stage 4 chronic kidney disease, or unspecified chronic kidney disease: Secondary | ICD-10-CM | POA: Diagnosis not present

## 2015-02-19 DIAGNOSIS — E1122 Type 2 diabetes mellitus with diabetic chronic kidney disease: Secondary | ICD-10-CM | POA: Diagnosis not present

## 2015-02-21 DIAGNOSIS — N183 Chronic kidney disease, stage 3 (moderate): Secondary | ICD-10-CM | POA: Diagnosis not present

## 2015-02-21 DIAGNOSIS — E46 Unspecified protein-calorie malnutrition: Secondary | ICD-10-CM | POA: Diagnosis not present

## 2015-02-21 DIAGNOSIS — I480 Paroxysmal atrial fibrillation: Secondary | ICD-10-CM | POA: Diagnosis not present

## 2015-02-21 DIAGNOSIS — E1165 Type 2 diabetes mellitus with hyperglycemia: Secondary | ICD-10-CM | POA: Diagnosis not present

## 2015-02-21 DIAGNOSIS — I129 Hypertensive chronic kidney disease with stage 1 through stage 4 chronic kidney disease, or unspecified chronic kidney disease: Secondary | ICD-10-CM | POA: Diagnosis not present

## 2015-02-21 DIAGNOSIS — E1122 Type 2 diabetes mellitus with diabetic chronic kidney disease: Secondary | ICD-10-CM | POA: Diagnosis not present

## 2015-03-05 DIAGNOSIS — I129 Hypertensive chronic kidney disease with stage 1 through stage 4 chronic kidney disease, or unspecified chronic kidney disease: Secondary | ICD-10-CM | POA: Diagnosis not present

## 2015-03-05 DIAGNOSIS — E1122 Type 2 diabetes mellitus with diabetic chronic kidney disease: Secondary | ICD-10-CM | POA: Diagnosis not present

## 2015-03-05 DIAGNOSIS — N183 Chronic kidney disease, stage 3 (moderate): Secondary | ICD-10-CM | POA: Diagnosis not present

## 2015-03-05 DIAGNOSIS — E46 Unspecified protein-calorie malnutrition: Secondary | ICD-10-CM | POA: Diagnosis not present

## 2015-03-05 DIAGNOSIS — I480 Paroxysmal atrial fibrillation: Secondary | ICD-10-CM | POA: Diagnosis not present

## 2015-03-05 DIAGNOSIS — E1165 Type 2 diabetes mellitus with hyperglycemia: Secondary | ICD-10-CM | POA: Diagnosis not present

## 2015-03-06 DIAGNOSIS — I480 Paroxysmal atrial fibrillation: Secondary | ICD-10-CM | POA: Diagnosis not present

## 2015-03-06 DIAGNOSIS — E1165 Type 2 diabetes mellitus with hyperglycemia: Secondary | ICD-10-CM | POA: Diagnosis not present

## 2015-03-06 DIAGNOSIS — N183 Chronic kidney disease, stage 3 (moderate): Secondary | ICD-10-CM | POA: Diagnosis not present

## 2015-03-06 DIAGNOSIS — E46 Unspecified protein-calorie malnutrition: Secondary | ICD-10-CM | POA: Diagnosis not present

## 2015-03-06 DIAGNOSIS — E1122 Type 2 diabetes mellitus with diabetic chronic kidney disease: Secondary | ICD-10-CM | POA: Diagnosis not present

## 2015-03-06 DIAGNOSIS — I129 Hypertensive chronic kidney disease with stage 1 through stage 4 chronic kidney disease, or unspecified chronic kidney disease: Secondary | ICD-10-CM | POA: Diagnosis not present

## 2015-03-12 DIAGNOSIS — E46 Unspecified protein-calorie malnutrition: Secondary | ICD-10-CM | POA: Diagnosis not present

## 2015-03-12 DIAGNOSIS — I129 Hypertensive chronic kidney disease with stage 1 through stage 4 chronic kidney disease, or unspecified chronic kidney disease: Secondary | ICD-10-CM | POA: Diagnosis not present

## 2015-03-12 DIAGNOSIS — R350 Frequency of micturition: Secondary | ICD-10-CM | POA: Diagnosis not present

## 2015-03-12 DIAGNOSIS — I480 Paroxysmal atrial fibrillation: Secondary | ICD-10-CM | POA: Diagnosis not present

## 2015-03-12 DIAGNOSIS — Z23 Encounter for immunization: Secondary | ICD-10-CM | POA: Diagnosis not present

## 2015-03-12 DIAGNOSIS — E1165 Type 2 diabetes mellitus with hyperglycemia: Secondary | ICD-10-CM | POA: Diagnosis not present

## 2015-03-12 DIAGNOSIS — N183 Chronic kidney disease, stage 3 (moderate): Secondary | ICD-10-CM | POA: Diagnosis not present

## 2015-03-12 DIAGNOSIS — E1122 Type 2 diabetes mellitus with diabetic chronic kidney disease: Secondary | ICD-10-CM | POA: Diagnosis not present

## 2015-03-12 DIAGNOSIS — Z87440 Personal history of urinary (tract) infections: Secondary | ICD-10-CM | POA: Diagnosis not present

## 2015-03-12 DIAGNOSIS — Z125 Encounter for screening for malignant neoplasm of prostate: Secondary | ICD-10-CM | POA: Diagnosis not present

## 2015-03-12 DIAGNOSIS — Z794 Long term (current) use of insulin: Secondary | ICD-10-CM | POA: Diagnosis not present

## 2015-03-12 DIAGNOSIS — Z79899 Other long term (current) drug therapy: Secondary | ICD-10-CM | POA: Diagnosis not present

## 2015-03-15 DIAGNOSIS — R0609 Other forms of dyspnea: Secondary | ICD-10-CM | POA: Diagnosis not present

## 2015-03-29 DIAGNOSIS — I10 Essential (primary) hypertension: Secondary | ICD-10-CM | POA: Diagnosis not present

## 2015-03-29 DIAGNOSIS — R0609 Other forms of dyspnea: Secondary | ICD-10-CM | POA: Diagnosis not present

## 2015-03-29 DIAGNOSIS — I5022 Chronic systolic (congestive) heart failure: Secondary | ICD-10-CM | POA: Diagnosis not present

## 2015-03-29 DIAGNOSIS — E785 Hyperlipidemia, unspecified: Secondary | ICD-10-CM | POA: Diagnosis not present

## 2015-03-29 DIAGNOSIS — Z8674 Personal history of sudden cardiac arrest: Secondary | ICD-10-CM | POA: Diagnosis not present

## 2015-03-29 DIAGNOSIS — E1142 Type 2 diabetes mellitus with diabetic polyneuropathy: Secondary | ICD-10-CM | POA: Diagnosis not present

## 2015-03-29 DIAGNOSIS — Z95810 Presence of automatic (implantable) cardiac defibrillator: Secondary | ICD-10-CM | POA: Diagnosis not present

## 2015-03-29 DIAGNOSIS — I2510 Atherosclerotic heart disease of native coronary artery without angina pectoris: Secondary | ICD-10-CM | POA: Diagnosis not present

## 2015-03-29 DIAGNOSIS — R0602 Shortness of breath: Secondary | ICD-10-CM | POA: Diagnosis not present

## 2015-03-29 DIAGNOSIS — Z7901 Long term (current) use of anticoagulants: Secondary | ICD-10-CM | POA: Diagnosis not present

## 2015-03-29 DIAGNOSIS — I255 Ischemic cardiomyopathy: Secondary | ICD-10-CM | POA: Diagnosis not present

## 2015-03-29 DIAGNOSIS — I482 Chronic atrial fibrillation: Secondary | ICD-10-CM | POA: Diagnosis not present

## 2015-10-02 IMAGING — DX DG CHEST 2V
2 series · 2 of 2 positions shown · non-contrast
Comparison: 01/15/2015

CLINICAL DATA: Weakness for 3 weeks

EXAM:
CHEST  2 VIEW

[chest pa]
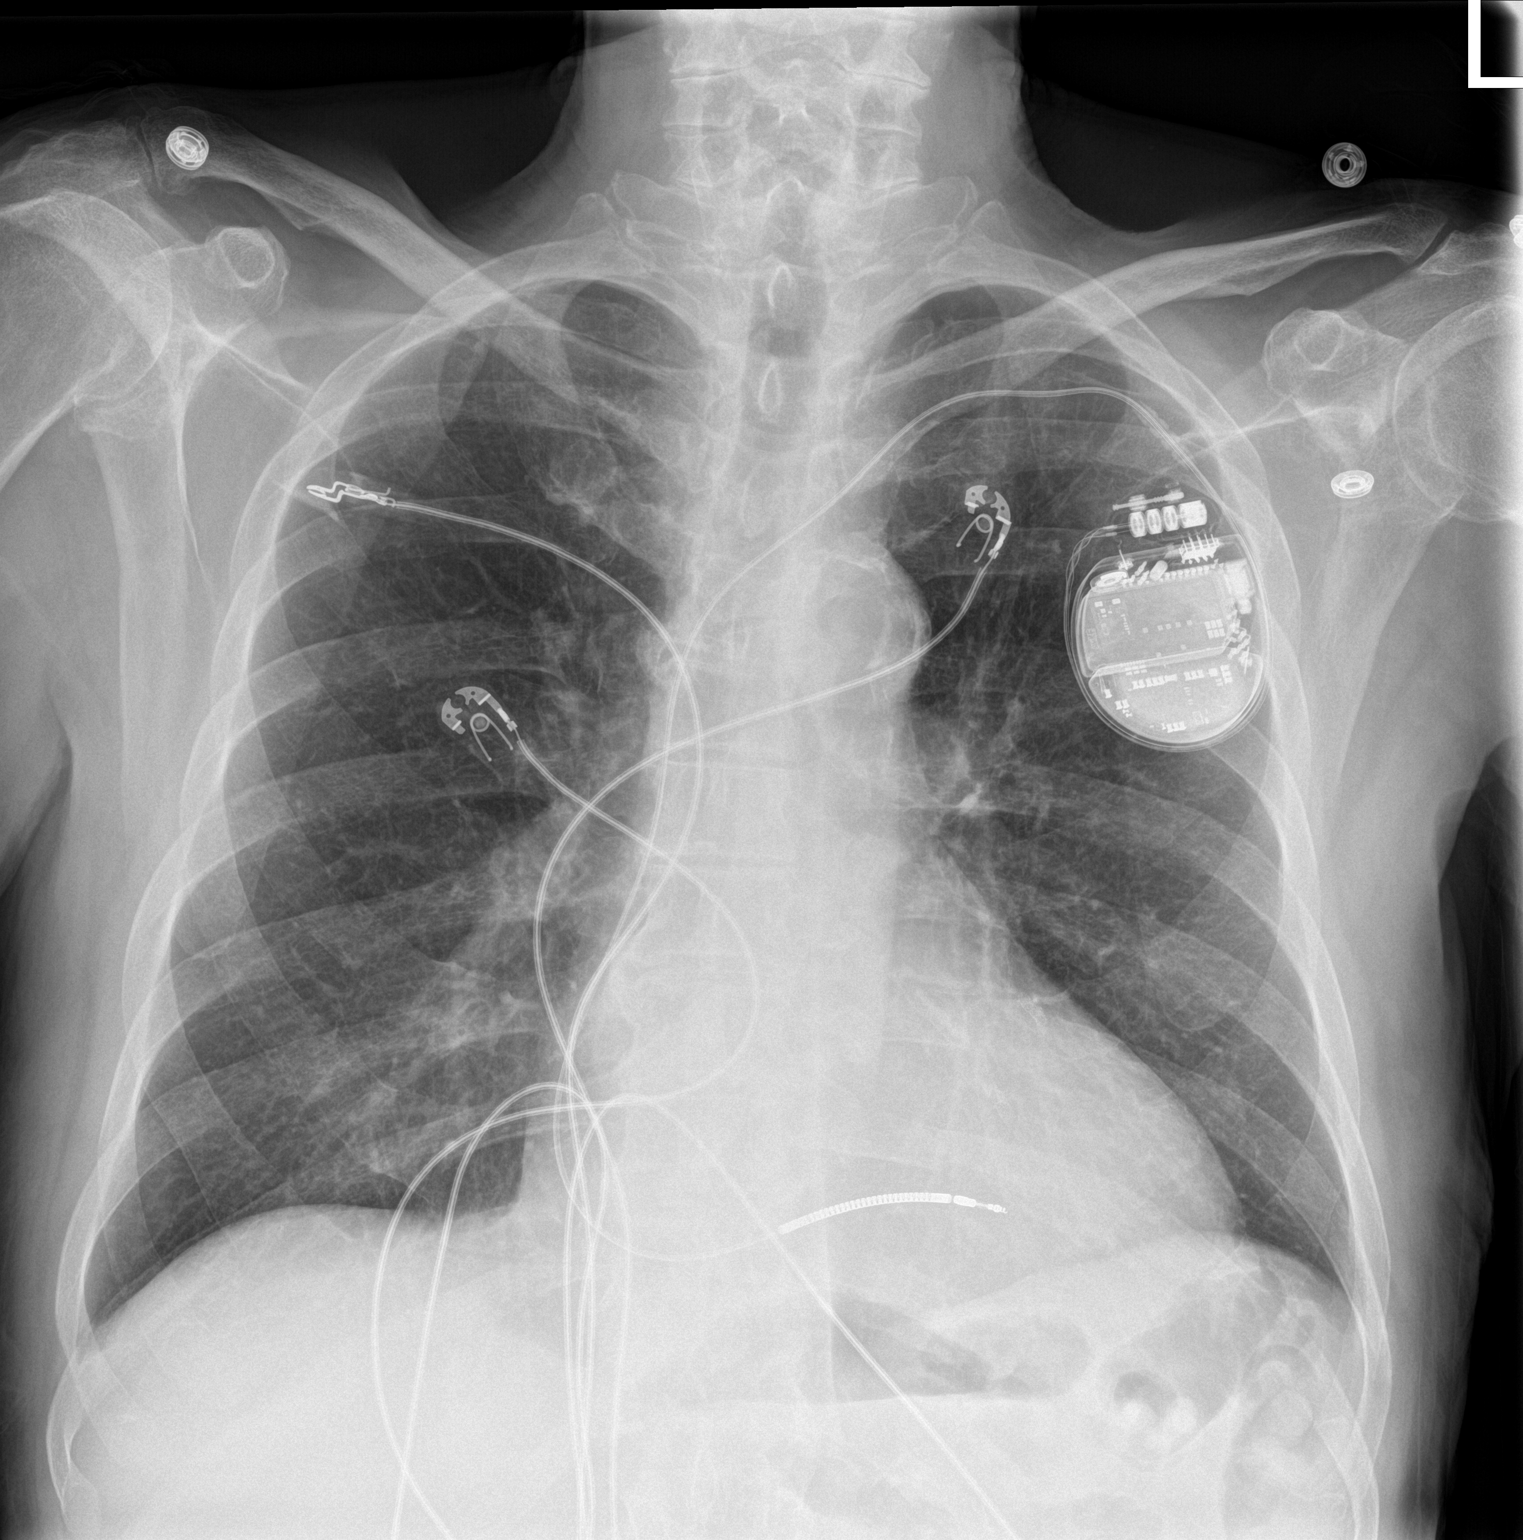

[chest lat]
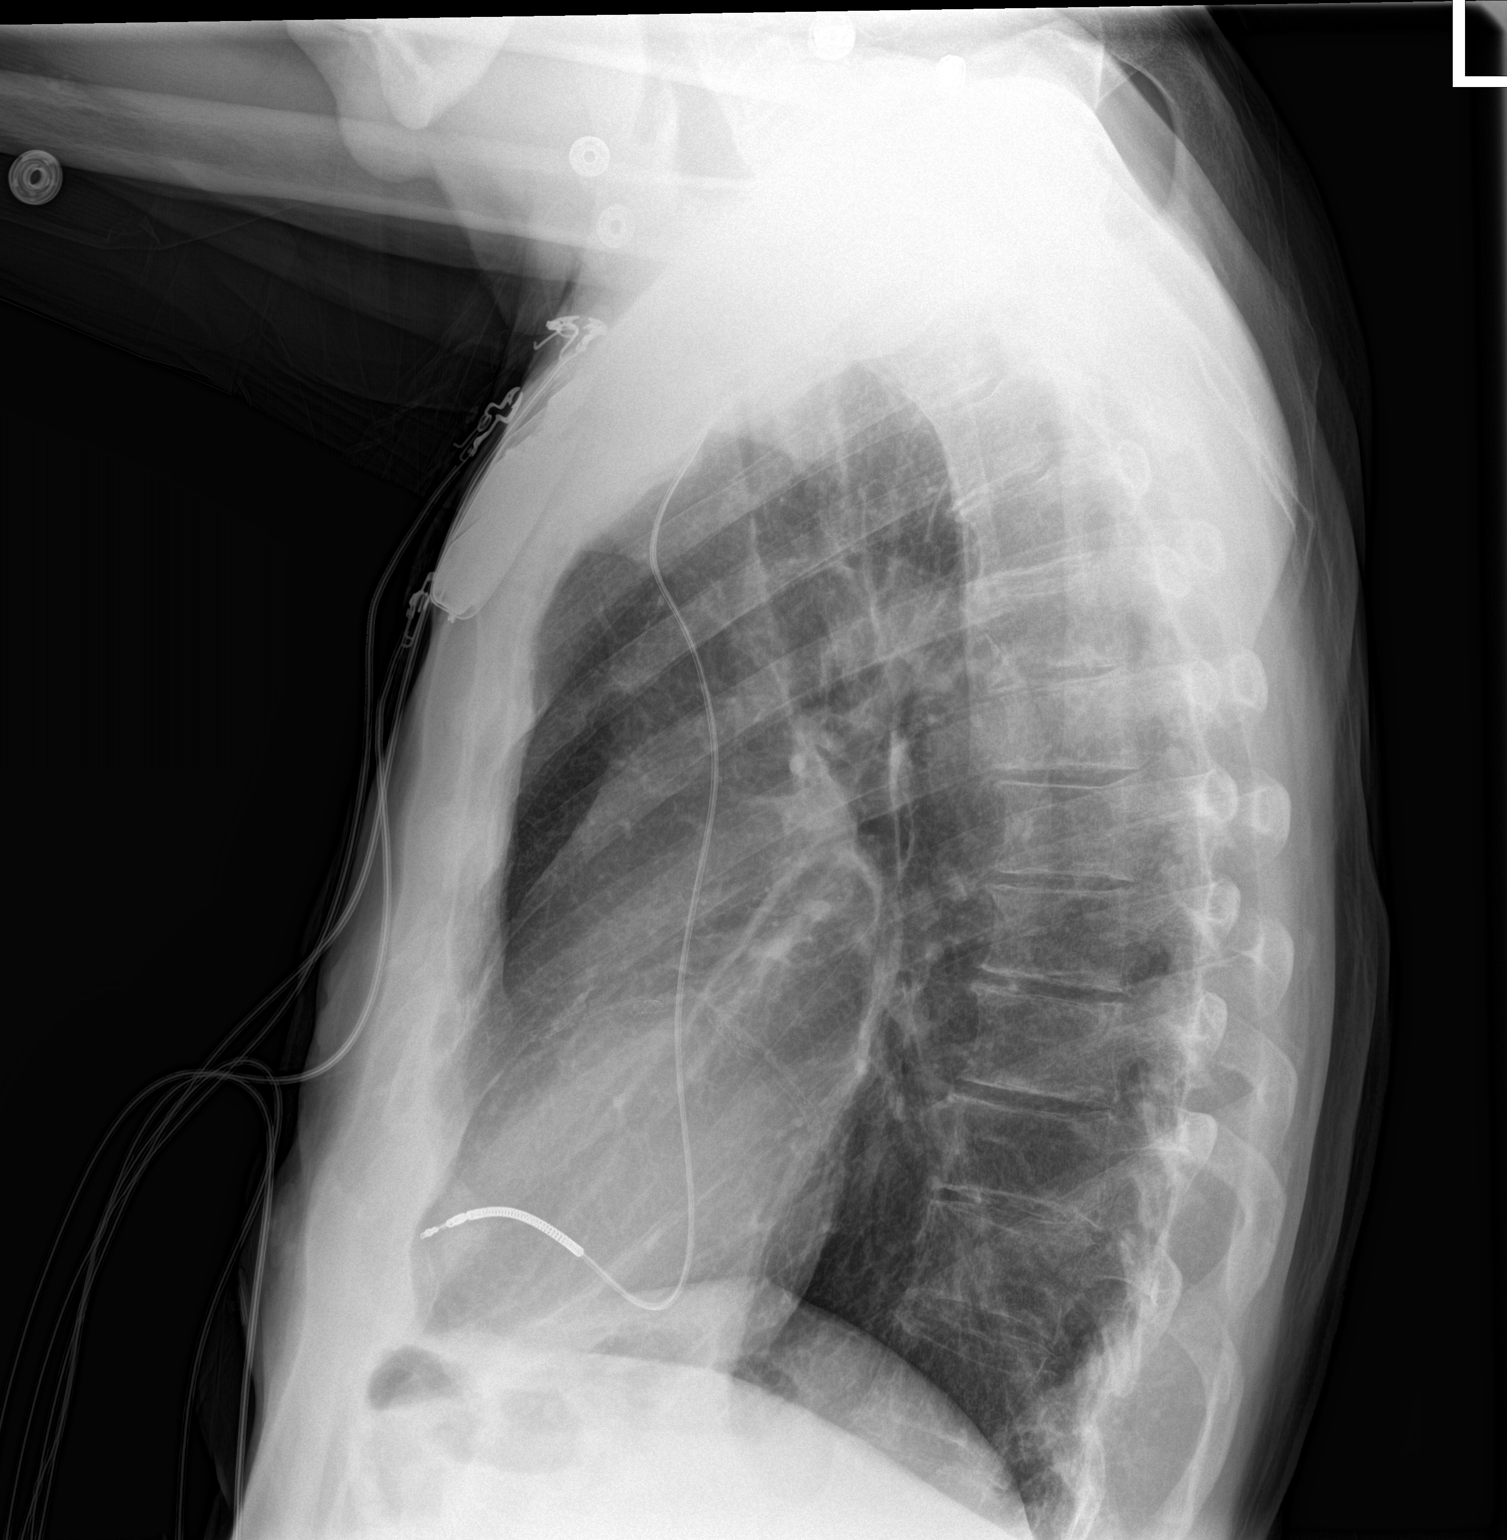

[2 of 2 positions shown; findings below may reference images not displayed]

FINDINGS: Cardiomediastinal silhouette is stable. Single lead cardiac
pacemaker is unchanged in position. No acute infiltrate or pleural
effusion. No pulmonary edema. Bony thorax is unremarkable.
IMPRESSION: No active cardiopulmonary disease.

## 2016-09-25 DIAGNOSIS — I480 Paroxysmal atrial fibrillation: Secondary | ICD-10-CM | POA: Diagnosis not present

## 2016-09-25 DIAGNOSIS — L309 Dermatitis, unspecified: Secondary | ICD-10-CM | POA: Diagnosis not present

## 2016-09-25 DIAGNOSIS — E1122 Type 2 diabetes mellitus with diabetic chronic kidney disease: Secondary | ICD-10-CM | POA: Diagnosis not present

## 2016-09-25 DIAGNOSIS — E782 Mixed hyperlipidemia: Secondary | ICD-10-CM | POA: Diagnosis not present

## 2016-09-25 DIAGNOSIS — Z794 Long term (current) use of insulin: Secondary | ICD-10-CM | POA: Diagnosis not present

## 2016-09-25 DIAGNOSIS — N183 Chronic kidney disease, stage 3 (moderate): Secondary | ICD-10-CM | POA: Diagnosis not present

## 2016-09-25 DIAGNOSIS — F17210 Nicotine dependence, cigarettes, uncomplicated: Secondary | ICD-10-CM | POA: Diagnosis not present

## 2016-09-25 DIAGNOSIS — I509 Heart failure, unspecified: Secondary | ICD-10-CM | POA: Diagnosis not present

## 2016-09-25 DIAGNOSIS — Z79899 Other long term (current) drug therapy: Secondary | ICD-10-CM | POA: Diagnosis not present

## 2016-09-25 DIAGNOSIS — N390 Urinary tract infection, site not specified: Secondary | ICD-10-CM | POA: Diagnosis not present

## 2016-09-25 DIAGNOSIS — E1121 Type 2 diabetes mellitus with diabetic nephropathy: Secondary | ICD-10-CM | POA: Diagnosis not present

## 2016-10-05 ENCOUNTER — Telehealth: Payer: Self-pay | Admitting: *Deleted

## 2016-10-05 NOTE — Telephone Encounter (Signed)
Spoke to patient regarding his device follow up. I asked patient if he had been having his ICD checked since his last f/u with [place] was in 2015. Patient states that he's been following with Dr.Tilley. I asked patient if he had his Merlin monitor set up at home. Patient states that he does not have his monitor set up. I explained the benefits of having the monitor set up and I told him to speak to [facility] about remote monitoring at his next appt. Patient voiced understanding. Will make a note in our records.

## 2016-11-11 DIAGNOSIS — H44522 Atrophy of globe, left eye: Secondary | ICD-10-CM | POA: Diagnosis not present

## 2016-11-11 DIAGNOSIS — Z961 Presence of intraocular lens: Secondary | ICD-10-CM | POA: Diagnosis not present

## 2016-11-11 DIAGNOSIS — B0052 Herpesviral keratitis: Secondary | ICD-10-CM | POA: Diagnosis not present

## 2016-11-11 DIAGNOSIS — H3322 Serous retinal detachment, left eye: Secondary | ICD-10-CM | POA: Diagnosis not present

## 2016-11-13 DIAGNOSIS — B0052 Herpesviral keratitis: Secondary | ICD-10-CM | POA: Diagnosis not present

## 2016-11-18 DIAGNOSIS — B0052 Herpesviral keratitis: Secondary | ICD-10-CM | POA: Diagnosis not present

## 2016-12-09 DIAGNOSIS — B0052 Herpesviral keratitis: Secondary | ICD-10-CM | POA: Diagnosis not present

## 2016-12-29 DIAGNOSIS — B0052 Herpesviral keratitis: Secondary | ICD-10-CM | POA: Diagnosis not present

## 2017-01-12 DIAGNOSIS — B0052 Herpesviral keratitis: Secondary | ICD-10-CM | POA: Diagnosis not present

## 2017-03-03 DIAGNOSIS — R0609 Other forms of dyspnea: Secondary | ICD-10-CM | POA: Diagnosis not present

## 2017-03-03 DIAGNOSIS — E785 Hyperlipidemia, unspecified: Secondary | ICD-10-CM | POA: Diagnosis not present

## 2017-03-03 DIAGNOSIS — Z8674 Personal history of sudden cardiac arrest: Secondary | ICD-10-CM | POA: Diagnosis not present

## 2017-03-03 DIAGNOSIS — I255 Ischemic cardiomyopathy: Secondary | ICD-10-CM | POA: Diagnosis not present

## 2017-03-03 DIAGNOSIS — Z95810 Presence of automatic (implantable) cardiac defibrillator: Secondary | ICD-10-CM | POA: Diagnosis not present

## 2017-03-03 DIAGNOSIS — Z7901 Long term (current) use of anticoagulants: Secondary | ICD-10-CM | POA: Diagnosis not present

## 2017-03-03 DIAGNOSIS — I5022 Chronic systolic (congestive) heart failure: Secondary | ICD-10-CM | POA: Diagnosis not present

## 2017-03-03 DIAGNOSIS — E1142 Type 2 diabetes mellitus with diabetic polyneuropathy: Secondary | ICD-10-CM | POA: Diagnosis not present

## 2017-03-03 DIAGNOSIS — I10 Essential (primary) hypertension: Secondary | ICD-10-CM | POA: Diagnosis not present

## 2017-03-03 DIAGNOSIS — I2510 Atherosclerotic heart disease of native coronary artery without angina pectoris: Secondary | ICD-10-CM | POA: Diagnosis not present

## 2017-03-03 DIAGNOSIS — I482 Chronic atrial fibrillation: Secondary | ICD-10-CM | POA: Diagnosis not present

## 2017-04-26 DIAGNOSIS — Z23 Encounter for immunization: Secondary | ICD-10-CM | POA: Diagnosis not present

## 2017-04-26 DIAGNOSIS — E1121 Type 2 diabetes mellitus with diabetic nephropathy: Secondary | ICD-10-CM | POA: Diagnosis not present

## 2017-04-26 DIAGNOSIS — N182 Chronic kidney disease, stage 2 (mild): Secondary | ICD-10-CM | POA: Diagnosis not present

## 2017-04-26 DIAGNOSIS — N3000 Acute cystitis without hematuria: Secondary | ICD-10-CM | POA: Diagnosis not present

## 2017-05-24 ENCOUNTER — Inpatient Hospital Stay (HOSPITAL_COMMUNITY)
Admission: EM | Admit: 2017-05-24 | Discharge: 2017-05-27 | DRG: 871 | Disposition: A | Discharge status: Home Health | Payer: Medicare Other | Attending: Internal Medicine | Admitting: Internal Medicine

## 2017-05-24 ENCOUNTER — Encounter (HOSPITAL_COMMUNITY): Payer: Self-pay

## 2017-05-24 ENCOUNTER — Other Ambulatory Visit: Payer: Self-pay

## 2017-05-24 ENCOUNTER — Emergency Department (HOSPITAL_COMMUNITY): Payer: Medicare Other

## 2017-05-24 DIAGNOSIS — N183 Chronic kidney disease, stage 3 (moderate): Secondary | ICD-10-CM | POA: Diagnosis present

## 2017-05-24 DIAGNOSIS — R000 Tachycardia, unspecified: Secondary | ICD-10-CM | POA: Diagnosis not present

## 2017-05-24 DIAGNOSIS — I2510 Atherosclerotic heart disease of native coronary artery without angina pectoris: Secondary | ICD-10-CM | POA: Diagnosis present

## 2017-05-24 DIAGNOSIS — E1122 Type 2 diabetes mellitus with diabetic chronic kidney disease: Secondary | ICD-10-CM | POA: Diagnosis present

## 2017-05-24 DIAGNOSIS — I130 Hypertensive heart and chronic kidney disease with heart failure and stage 1 through stage 4 chronic kidney disease, or unspecified chronic kidney disease: Secondary | ICD-10-CM | POA: Diagnosis present

## 2017-05-24 DIAGNOSIS — J441 Chronic obstructive pulmonary disease with (acute) exacerbation: Secondary | ICD-10-CM | POA: Diagnosis present

## 2017-05-24 DIAGNOSIS — R64 Cachexia: Secondary | ICD-10-CM | POA: Diagnosis present

## 2017-05-24 DIAGNOSIS — D631 Anemia in chronic kidney disease: Secondary | ICD-10-CM | POA: Diagnosis present

## 2017-05-24 DIAGNOSIS — J9691 Respiratory failure, unspecified with hypoxia: Secondary | ICD-10-CM | POA: Diagnosis not present

## 2017-05-24 DIAGNOSIS — I255 Ischemic cardiomyopathy: Secondary | ICD-10-CM | POA: Diagnosis not present

## 2017-05-24 DIAGNOSIS — Z66 Do not resuscitate: Secondary | ICD-10-CM | POA: Diagnosis present

## 2017-05-24 DIAGNOSIS — R6520 Severe sepsis without septic shock: Secondary | ICD-10-CM | POA: Diagnosis present

## 2017-05-24 DIAGNOSIS — I252 Old myocardial infarction: Secondary | ICD-10-CM

## 2017-05-24 DIAGNOSIS — J9621 Acute and chronic respiratory failure with hypoxia: Secondary | ICD-10-CM

## 2017-05-24 DIAGNOSIS — Z95810 Presence of automatic (implantable) cardiac defibrillator: Secondary | ICD-10-CM | POA: Diagnosis not present

## 2017-05-24 DIAGNOSIS — F17210 Nicotine dependence, cigarettes, uncomplicated: Secondary | ICD-10-CM | POA: Diagnosis present

## 2017-05-24 DIAGNOSIS — Z794 Long term (current) use of insulin: Secondary | ICD-10-CM | POA: Diagnosis not present

## 2017-05-24 DIAGNOSIS — J9690 Respiratory failure, unspecified, unspecified whether with hypoxia or hypercapnia: Secondary | ICD-10-CM | POA: Diagnosis present

## 2017-05-24 DIAGNOSIS — R531 Weakness: Secondary | ICD-10-CM | POA: Diagnosis not present

## 2017-05-24 DIAGNOSIS — Z6824 Body mass index (BMI) 24.0-24.9, adult: Secondary | ICD-10-CM

## 2017-05-24 DIAGNOSIS — J440 Chronic obstructive pulmonary disease with acute lower respiratory infection: Secondary | ICD-10-CM | POA: Diagnosis present

## 2017-05-24 DIAGNOSIS — R4182 Altered mental status, unspecified: Secondary | ICD-10-CM

## 2017-05-24 DIAGNOSIS — Z515 Encounter for palliative care: Secondary | ICD-10-CM | POA: Diagnosis not present

## 2017-05-24 DIAGNOSIS — Z7901 Long term (current) use of anticoagulants: Secondary | ICD-10-CM | POA: Diagnosis not present

## 2017-05-24 DIAGNOSIS — E118 Type 2 diabetes mellitus with unspecified complications: Secondary | ICD-10-CM | POA: Diagnosis not present

## 2017-05-24 DIAGNOSIS — E872 Acidosis: Secondary | ICD-10-CM | POA: Diagnosis present

## 2017-05-24 DIAGNOSIS — J9601 Acute respiratory failure with hypoxia: Secondary | ICD-10-CM | POA: Diagnosis present

## 2017-05-24 DIAGNOSIS — E785 Hyperlipidemia, unspecified: Secondary | ICD-10-CM | POA: Diagnosis present

## 2017-05-24 DIAGNOSIS — N179 Acute kidney failure, unspecified: Secondary | ICD-10-CM

## 2017-05-24 DIAGNOSIS — J189 Pneumonia, unspecified organism: Secondary | ICD-10-CM | POA: Diagnosis present

## 2017-05-24 DIAGNOSIS — I5022 Chronic systolic (congestive) heart failure: Secondary | ICD-10-CM | POA: Diagnosis present

## 2017-05-24 DIAGNOSIS — A419 Sepsis, unspecified organism: Principal | ICD-10-CM | POA: Diagnosis present

## 2017-05-24 DIAGNOSIS — R918 Other nonspecific abnormal finding of lung field: Secondary | ICD-10-CM | POA: Diagnosis not present

## 2017-05-24 DIAGNOSIS — I499 Cardiac arrhythmia, unspecified: Secondary | ICD-10-CM | POA: Diagnosis not present

## 2017-05-24 DIAGNOSIS — I482 Chronic atrial fibrillation: Secondary | ICD-10-CM | POA: Diagnosis present

## 2017-05-24 DIAGNOSIS — I361 Nonrheumatic tricuspid (valve) insufficiency: Secondary | ICD-10-CM | POA: Diagnosis not present

## 2017-05-24 DIAGNOSIS — Z7189 Other specified counseling: Secondary | ICD-10-CM | POA: Diagnosis not present

## 2017-05-24 DIAGNOSIS — R404 Transient alteration of awareness: Secondary | ICD-10-CM | POA: Diagnosis not present

## 2017-05-24 DIAGNOSIS — I4891 Unspecified atrial fibrillation: Secondary | ICD-10-CM

## 2017-05-24 DIAGNOSIS — R0602 Shortness of breath: Secondary | ICD-10-CM | POA: Diagnosis not present

## 2017-05-24 DIAGNOSIS — I493 Ventricular premature depolarization: Secondary | ICD-10-CM | POA: Diagnosis present

## 2017-05-24 DIAGNOSIS — Z955 Presence of coronary angioplasty implant and graft: Secondary | ICD-10-CM | POA: Diagnosis not present

## 2017-05-24 DIAGNOSIS — N1330 Unspecified hydronephrosis: Secondary | ICD-10-CM | POA: Diagnosis not present

## 2017-05-24 DIAGNOSIS — L8990 Pressure ulcer of unspecified site, unspecified stage: Secondary | ICD-10-CM | POA: Diagnosis present

## 2017-05-24 LAB — LACTIC ACID, PLASMA
LACTIC ACID, VENOUS: 3.2 mmol/L — AB (ref 0.5–1.9)
Lactic Acid, Venous: 3.1 mmol/L (ref 0.5–1.9)

## 2017-05-24 LAB — CBC WITH DIFFERENTIAL/PLATELET
BASOS ABS: 0 10*3/uL (ref 0.0–0.1)
BASOS PCT: 0 %
EOS ABS: 0 10*3/uL (ref 0.0–0.7)
Eosinophils Relative: 0 %
HEMATOCRIT: 48.9 % (ref 39.0–52.0)
HEMOGLOBIN: 15.7 g/dL (ref 13.0–17.0)
Lymphocytes Relative: 4 %
Lymphs Abs: 0.7 10*3/uL (ref 0.7–4.0)
MCH: 30.1 pg (ref 26.0–34.0)
MCHC: 32.1 g/dL (ref 30.0–36.0)
MCV: 93.7 fL (ref 78.0–100.0)
Monocytes Absolute: 0.8 10*3/uL (ref 0.1–1.0)
Monocytes Relative: 5 %
NEUTROS PCT: 91 %
Neutro Abs: 13.9 10*3/uL — ABNORMAL HIGH (ref 1.7–7.7)
Platelets: 200 10*3/uL (ref 150–400)
RBC: 5.22 MIL/uL (ref 4.22–5.81)
RDW: 15.9 % — ABNORMAL HIGH (ref 11.5–15.5)
WBC: 15.3 10*3/uL — ABNORMAL HIGH (ref 4.0–10.5)

## 2017-05-24 LAB — COMPREHENSIVE METABOLIC PANEL
ALBUMIN: 2.7 g/dL — AB (ref 3.5–5.0)
ALK PHOS: 114 U/L (ref 38–126)
ALT: 57 U/L (ref 17–63)
ANION GAP: 15 (ref 5–15)
AST: 57 U/L — ABNORMAL HIGH (ref 15–41)
BUN: 110 mg/dL — AB (ref 6–20)
CALCIUM: 8.5 mg/dL — AB (ref 8.9–10.3)
CO2: 22 mmol/L (ref 22–32)
Chloride: 98 mmol/L — ABNORMAL LOW (ref 101–111)
Creatinine, Ser: 2.69 mg/dL — ABNORMAL HIGH (ref 0.61–1.24)
GFR calc Af Amer: 25 mL/min — ABNORMAL LOW (ref >60)
GFR calc non Af Amer: 21 mL/min — ABNORMAL LOW (ref >60)
GLUCOSE: 193 mg/dL — AB (ref 65–99)
Potassium: 5.3 mmol/L — ABNORMAL HIGH (ref 3.5–5.1)
Sodium: 135 mmol/L (ref 135–145)
TOTAL PROTEIN: 5 g/dL — AB (ref 6.5–8.1)
Total Bilirubin: 1.9 mg/dL — ABNORMAL HIGH (ref 0.3–1.2)

## 2017-05-24 LAB — URINALYSIS, ROUTINE W REFLEX MICROSCOPIC
BILIRUBIN URINE: NEGATIVE
GLUCOSE, UA: NEGATIVE mg/dL
HGB URINE DIPSTICK: NEGATIVE
Ketones, ur: NEGATIVE mg/dL
Leukocytes, UA: NEGATIVE
Nitrite: NEGATIVE
PROTEIN: NEGATIVE mg/dL
Specific Gravity, Urine: 1.016 (ref 1.005–1.030)
pH: 5 (ref 5.0–8.0)

## 2017-05-24 LAB — I-STAT CHEM 8, ED
BUN: 105 mg/dL — ABNORMAL HIGH (ref 6–20)
CALCIUM ION: 0.95 mmol/L — AB (ref 1.15–1.40)
CREATININE: 2.4 mg/dL — AB (ref 0.61–1.24)
Chloride: 102 mmol/L (ref 101–111)
GLUCOSE: 179 mg/dL — AB (ref 65–99)
HCT: 46 % (ref 39.0–52.0)
HEMOGLOBIN: 15.6 g/dL (ref 13.0–17.0)
Potassium: 5.3 mmol/L — ABNORMAL HIGH (ref 3.5–5.1)
Sodium: 135 mmol/L (ref 135–145)
TCO2: 24 mmol/L (ref 22–32)

## 2017-05-24 LAB — I-STAT TROPONIN, ED: Troponin i, poc: 0.1 ng/mL (ref 0.00–0.08)

## 2017-05-24 LAB — I-STAT CG4 LACTIC ACID, ED
Lactic Acid, Venous: 5.49 mmol/L (ref 0.5–1.9)
Lactic Acid, Venous: 3.53 mmol/L (ref 0.5–1.9)

## 2017-05-24 LAB — DIGOXIN LEVEL: DIGOXIN LVL: 0.6 ng/mL — AB (ref 0.8–2.0)

## 2017-05-24 LAB — PROCALCITONIN: Procalcitonin: 0.23 ng/mL

## 2017-05-24 LAB — MAGNESIUM: Magnesium: 2.7 mg/dL — ABNORMAL HIGH (ref 1.7–2.4)

## 2017-05-24 LAB — STREP PNEUMONIAE URINARY ANTIGEN: STREP PNEUMO URINARY ANTIGEN: NEGATIVE

## 2017-05-24 LAB — TROPONIN I: Troponin I: 0.11 ng/mL (ref <0.03)

## 2017-05-24 MED ORDER — DEXAMETHASONE SODIUM PHOSPHATE 10 MG/ML IJ SOLN: 10.0000 mg | Freq: Once | INTRAMUSCULAR | Status: DC

## 2017-05-24 MED ORDER — INSULIN ASPART 100 UNIT/ML ~~LOC~~ SOLN
0.0000 [IU] | Freq: Three times a day (TID) | SUBCUTANEOUS | Status: DC
  Administered 2017-05-25 – 2017-05-26 (×4): 1 [IU] via SUBCUTANEOUS
  Administered 2017-05-26: 2 [IU] via SUBCUTANEOUS
  Administered 2017-05-26: 1 [IU] via SUBCUTANEOUS
  Filled 2017-05-24: qty 1

## 2017-05-24 MED ORDER — SODIUM CHLORIDE 0.9 % IV BOLUS (SEPSIS)
1500.0000 mL | Freq: Once | INTRAVENOUS | Status: AC
  Administered 2017-05-24: 1500 mL via INTRAVENOUS

## 2017-05-24 MED ORDER — FUROSEMIDE 20 MG PO TABS
20.0000 mg | ORAL_TABLET | Freq: Every day | ORAL | Status: DC
  Administered 2017-05-25: 20 mg via ORAL
  Filled 2017-05-24: qty 1

## 2017-05-24 MED ORDER — INSULIN ASPART 100 UNIT/ML ~~LOC~~ SOLN: 0.0000 [IU] | Freq: Every day | SUBCUTANEOUS | Status: DC

## 2017-05-24 MED ORDER — DEXTROSE 5 % IV SOLN
1.0000 g | Freq: Once | INTRAVENOUS | Status: AC
  Administered 2017-05-24: 1 g via INTRAVENOUS
  Filled 2017-05-24: qty 10

## 2017-05-24 MED ORDER — SODIUM CHLORIDE 0.9 % IV SOLN
INTRAVENOUS | Status: DC
  Administered 2017-05-24 – 2017-05-26 (×2): via INTRAVENOUS

## 2017-05-24 MED ORDER — APIXABAN 5 MG PO TABS
5.0000 mg | ORAL_TABLET | Freq: Two times a day (BID) | ORAL | Status: DC
  Administered 2017-05-25 – 2017-05-27 (×6): 5 mg via ORAL
  Filled 2017-05-24 (×7): qty 1

## 2017-05-24 MED ORDER — ONDANSETRON HCL 4 MG/2ML IJ SOLN: 4.0000 mg | Freq: Four times a day (QID) | INTRAMUSCULAR | Status: DC | PRN

## 2017-05-24 MED ORDER — IPRATROPIUM-ALBUTEROL 0.5-2.5 (3) MG/3ML IN SOLN: 3.0000 mL | Freq: Four times a day (QID) | RESPIRATORY_TRACT | Status: DC | PRN

## 2017-05-24 MED ORDER — SODIUM CHLORIDE 0.9 % IV BOLUS (SEPSIS)
1000.0000 mL | Freq: Once | INTRAVENOUS | Status: AC
  Administered 2017-05-24: 1000 mL via INTRAVENOUS

## 2017-05-24 MED ORDER — DIGOXIN 125 MCG PO TABS
0.1250 mg | ORAL_TABLET | Freq: Every day | ORAL | Status: DC
  Administered 2017-05-24 – 2017-05-27 (×4): 0.125 mg via ORAL
  Filled 2017-05-24 (×4): qty 1

## 2017-05-24 MED ORDER — SODIUM CHLORIDE 0.9 % IV SOLN
INTRAVENOUS | Status: DC
  Administered 2017-05-24: 14:00:00 via INTRAVENOUS

## 2017-05-24 MED ORDER — DEXTROSE 5 % IV SOLN
1.0000 g | INTRAVENOUS | Status: DC
  Administered 2017-05-25 – 2017-05-26 (×2): 1 g via INTRAVENOUS
  Filled 2017-05-24 (×2): qty 10

## 2017-05-24 MED ORDER — NICOTINE 14 MG/24HR TD PT24
14.0000 mg | MEDICATED_PATCH | Freq: Once | TRANSDERMAL | Status: AC
  Administered 2017-05-24: 14 mg via TRANSDERMAL
  Filled 2017-05-24: qty 1

## 2017-05-24 MED ORDER — SENNA 8.6 MG PO TABS: 1.0000 | ORAL_TABLET | Freq: Every day | ORAL | Status: DC | PRN

## 2017-05-24 MED ORDER — ONDANSETRON HCL 4 MG PO TABS: 4.0000 mg | ORAL_TABLET | Freq: Four times a day (QID) | ORAL | Status: DC | PRN

## 2017-05-24 MED ORDER — SENNOSIDES 15 MG PO TABS: ORAL_TABLET | ORAL | Status: DC | PRN

## 2017-05-24 MED ORDER — AZITHROMYCIN 500 MG IV SOLR
500.0000 mg | INTRAVENOUS | Status: DC
  Administered 2017-05-25 – 2017-05-26 (×2): 500 mg via INTRAVENOUS
  Filled 2017-05-24 (×2): qty 500

## 2017-05-24 MED ORDER — METOPROLOL SUCCINATE ER 50 MG PO TB24
50.0000 mg | ORAL_TABLET | Freq: Two times a day (BID) | ORAL | Status: DC
  Filled 2017-05-24 (×3): qty 1

## 2017-05-24 MED ORDER — AZITHROMYCIN 250 MG PO TABS
500.0000 mg | ORAL_TABLET | Freq: Once | ORAL | Status: AC
  Administered 2017-05-24: 500 mg via ORAL
  Filled 2017-05-24: qty 2

## 2017-05-24 NOTE — ED Provider Notes (Signed)
[facility] EMERGENCY DEPARTMENT Provider Note   CSN: [id number] Arrival date & time: 05/24/17  1323     History   Chief Complaint Chief Complaint  Patient presents with  . Weakness  . Irregular Heart Beat    HPI Tyler Cameron is a 76 y.o. male.  HPI  The patient is a 76 year old male, he has a known history of atrial fibrillation, he has a defibrillator present, he has had coronary disease with stenting to his left anterior descending, type 2 diabetes, hyperlipidemia and an ejection fraction of 20% based on an echocardiogram from 2013.  The patient does have chronic systolic heart failure.  He presents to the hospital today from the doctor's office when he was found to be having significant amount of ectopy, he was generally weak and feeling poorly, the paramedics noted that he was in atrial fibrillation but had frequent PVCs, he had significant decrease in his mental status with somnolence though he was arousable and able to.  The patient's level of consciousness is borderline, he does answer occasional questions however a level 5 caveat applies secondary to the patient's change in mental status.  Past Medical History:  Diagnosis Date  . AICD (automatic cardioverter/defibrillator) present    Implant 08/02/12 Dr. Caryl Comes RV lead St[employer] Jude 7122 Q  serial #[id number].  [place] CD  [phone] Q , serial [id number]   . Atrial fibrillation ([place])    Diagnosed 2008, Tried Pradaxa and Xarelto that caused hematuria Refuses warfarin   . CAD (coronary artery disease) 07/30/2012   Inferior MI 2001 Stent to LAD and circ 2001 Refused cath 2008 in [place] Ischemic cardiomyopathy   . Diabetes mellitus type 2, noninsulin dependent ([employer])   . Herpes simplex of eye   . Hyperlipidemia   . Hypertensive heart disease without CHF   . Ischemic cardiomyopathy 07/30/2012   Present since 2001 EKF 20% by ECHO 12/02/11   . Left atrial thrombus    He developed atrial fibrillation 2008  and was found to have a mass in his atrium on a CT scan. Confirmed by TEE that also showed spontaneous contrast.  He was placed on Pradaxa and had 2 TEE's  that eventually showed some reduction in the size of his thrombus but he refused to have a third TEE. He was taken off of Pradaxa and later placed on Xarelto but developed hematuria on Xarelto and quit taking this. He refused to consider going on warfarin and was willing to take the risks of stroke being on just aspirin alone     Patient Active Problem List   Diagnosis Date Noted  . Acute on chronic systolic heart failure ([place]) 01/18/2013  . AICD (automatic cardioverter/defibrillator) present   . Diabetes mellitus type 2, noninsulin dependent ([place])   . Ventricular tachycardia ([place]) 07/30/2012  . CAD (coronary artery disease) 07/30/2012  . Ischemic cardiomyopathy 07/30/2012  . Atrial fibrillation ([place])   . Left atrial thrombus   . Hyperlipidemia   . Hypertensive heart disease without CHF     Past Surgical History:  Procedure Laterality Date  . ANAL FISSURECTOMY    . CARDIAC DEFIBRILLATOR PLACEMENT  08/02/12   St. Jude device  . CORNEAL TRANSPLANT    . CORONARY ANGIOPLASTY WITH STENT PLACEMENT  2001  . RETINAL DETACHMENT SURGERY         Home Medications    Prior to Admission medications   Medication Sig Start Date End Date Taking? Authorizing Provider  apixaban Arne [place])  5 MG TABS tablet Take 1 tablet (5 mg total) by mouth 2 (two) times daily. 01/18/13  Yes Deboraha Sprang, MD  digoxin (LANOXIN) 0.125 MG tablet Take 1 tablet (0.125 mg total) by mouth daily. 01/18/13  Yes Deboraha Sprang, MD  furosemide (LASIX) 20 MG tablet Take 20 mg daily by mouth.    Yes [provider]  insulin aspart (NOVOLOG) 100 UNIT/ML injection Inject 5-8 Units See admin instructions into the skin. 8 units in the morning, 5 units in the evening   Yes [provider]  lisinopril (PRINIVIL,ZESTRIL) 2.5 MG tablet Take 1 tablet (2.5 mg total)  by mouth daily. 01/18/13  Yes Deboraha Sprang, MD  metoprolol succinate (TOPROL-XL) 50 MG 24 hr tablet Take 1 tablet (50 mg total) by mouth 2 (two) times daily. Take with or immediately following a meal. 01/18/13  Yes Deboraha Sprang, MD  nitroGLYCERIN (NITROSTAT) 0.4 MG SL tablet Place 1 tablet (0.4 mg total) under the tongue every 5 (five) minutes x 3 doses as needed for chest pain. 08/03/12  Yes Jacolyn Reedy, MD    Family History History reviewed. No pertinent family history.  Social History Social History   Tobacco Use  . Smoking status: Current Some Day Smoker    Packs/day: 1.00    Years: 50.00    Pack years: 50.00    Types: Cigarettes  . Smokeless tobacco: Never Used  Substance Use Topics  . Alcohol use: Yes    Alcohol/week: 1.5 oz    Types: 3 Standard drinks or equivalent per week  . Drug use: No     Allergies   Lopid [gemfibrozil]; Pradaxa [dabigatran etexilate mesylate]; and Xarelto [rivaroxaban]   Review of Systems Review of Systems  Unable to perform ROS: Mental status change     Physical Exam Updated Vital Signs BP 108/78 (BP Location: Right Arm)   Pulse 91   Temp (!) 96.8 F (36 C) (Rectal)   Resp (!) 30   Ht 5\' 10"  (1.778 m)   Wt 78 kg (172 lb)   SpO2 98%   BMI 24.68 kg/m   Physical Exam  Constitutional: He appears well-developed and well-nourished. He appears distressed.  HENT:  Head: Normocephalic and atraumatic.  Mouth/Throat: No oropharyngeal exudate.  Dry MM  Eyes: Conjunctivae and EOM are normal. Pupils are equal, round, and reactive to light. Right eye exhibits no discharge. Left eye exhibits no discharge. No scleral icterus.  Neck: Normal range of motion. Neck supple. No JVD present. No thyromegaly present.  Cardiovascular: Normal heart sounds and intact distal pulses. Exam reveals no gallop and no friction rub.  No murmur heard. Irregular rhythm, rate in the 80's.  Pulmonary/Chest: Effort normal and breath sounds normal. No  respiratory distress. He has no wheezes. He has no rales.  Has clear lung sounds but decreased - and mild tachypnea.  Abdominal: Soft. Bowel sounds are normal. He exhibits no distension and no mass. There is no tenderness.  Musculoskeletal: Normal range of motion. He exhibits no edema or tenderness.  Lymphadenopathy:    He has no cervical adenopathy.  Neurological: Coordination normal.  Somnolent but arousable - follows commands - has generalized weakness.   Skin: Skin is warm and dry. No rash noted. No erythema.  Psychiatric: He has a normal mood and affect. His behavior is normal.  Nursing note and vitals reviewed.    ED Treatments / Results  Labs (all labs ordered are listed, but only abnormal results are displayed) Labs  Reviewed  CBC WITH DIFFERENTIAL/PLATELET - Abnormal; Notable for the following components:      Result Value   WBC 15.3 (*)    RDW 15.9 (*)    Neutro Abs 13.9 (*)    All other components within normal limits  COMPREHENSIVE METABOLIC PANEL - Abnormal; Notable for the following components:   Potassium 5.3 (*)    Chloride 98 (*)    Glucose, Bld 193 (*)    BUN 110 (*)    Creatinine, Ser 2.69 (*)    Calcium 8.5 (*)    Total Protein 5.0 (*)    Albumin 2.7 (*)    AST 57 (*)    Total Bilirubin 1.9 (*)    GFR calc non Af Amer 21 (*)    GFR calc Af Amer 25 (*)    All other components within normal limits  MAGNESIUM - Abnormal; Notable for the following components:   Magnesium 2.7 (*)    All other components within normal limits  I-STAT CG4 LACTIC ACID, ED - Abnormal; Notable for the following components:   Lactic Acid, Venous 5.49 (*)    All other components within normal limits  I-STAT CHEM 8, ED - Abnormal; Notable for the following components:   Potassium 5.3 (*)    BUN 105 (*)    Creatinine, Ser 2.40 (*)    Glucose, Bld 179 (*)    Calcium, Ion 0.95 (*)    All other components within normal limits  I-STAT TROPONIN, ED - Abnormal; Notable for the  following components:   Troponin i, poc 0.10 (*)    All other components within normal limits  CULTURE, BLOOD (ROUTINE X 2)  CULTURE, BLOOD (ROUTINE X 2)  URINALYSIS, ROUTINE W REFLEX MICROSCOPIC  I-STAT CG4 LACTIC ACID, ED    EKG  EKG Interpretation  Date/Time:  Monday May 24 2017 13:35:24 EST Ventricular Rate:  106 PR Interval:    QRS Duration: 139 QT Interval:  402 QTC Calculation: 461 R Axis:   104 Text Interpretation:  Sinus tachycardia Paired ventricular premature complexes Short PR interval Consider right atrial enlargement Nonspecific intraventricular conduction delay Anterior infarct, old Borderline repolarization abnormality Baseline wander in lead(s) V6 Since last tracing ectopy more frequent Confirmed by Noemi Chapel [phone]) on 05/24/2017 2:09:52 PM       Radiology Dg Chest Port 1 View  Result Date: 05/24/2017 CLINICAL DATA:  Weakness and hyperglycemia EXAM: PORTABLE CHEST 1 VIEW COMPARISON:  January 31, 2015 FINDINGS: There is focal airspace opacity in the right mid lung consistent with pneumonia. Lungs elsewhere are clear. There is cardiomegaly with pulmonary vascularity within normal limits. Pacemaker lead is attached to the right ventricle. There is aortic atherosclerosis. No adenopathy. No bone lesions. IMPRESSION: Airspace opacity consistent with pneumonia in right mid lung. Lungs elsewhere clear. There is cardiomegaly with pacemaker lead attached to right ventricle. There is aortic atherosclerosis. Aortic Atherosclerosis (ICD10-I70.0). Followup PA and lateral chest radiographs recommended in 3-4 weeks following trial of antibiotic therapy to ensure resolution and exclude underlying malignancy. Electronically Signed   By: Lowella Grip III M.D.   On: 05/24/2017 13:45    Procedures .Critical Care Performed by: Noemi Chapel, MD Authorized by: Noemi Chapel, MD   Critical care provider statement:    Critical care time (minutes):  35   Critical care time  was exclusive of:  Separately billable procedures and treating other patients   Critical care was necessary to treat or prevent imminent or life-threatening deterioration of the following conditions:  Sepsis and renal failure   Critical care was time spent personally by me on the following activities:  Ordering and performing treatments and interventions, ordering and review of laboratory studies, ordering and review of radiographic studies, pulse oximetry, re-evaluation of patient's condition, review of old charts, obtaining history from patient or surrogate, examination of patient, evaluation of patient's response to treatment, discussions with consultants, development of treatment plan with patient or surrogate and blood draw for specimens   I assumed direction of critical care for this patient from another provider in my specialty: no     (including critical care time)  Medications Ordered in ED Medications  0.9 %  sodium chloride infusion ( Intravenous New Bag/Given 05/24/17 1416)  nicotine (NICODERM CQ - dosed in mg/24 hours) patch 14 mg (14 mg Transdermal Patch Applied 05/24/17 1416)  sodium chloride 0.9 % bolus 1,000 mL (not administered)  cefTRIAXone (ROCEPHIN) 1 g in dextrose 5 % 50 mL IVPB (not administered)  azithromycin (ZITHROMAX) tablet 500 mg (not administered)  sodium chloride 0.9 % bolus 1,500 mL (not administered)     Initial Impression / Assessment and Plan / ED Course  I have reviewed the triage vital signs and the nursing notes.  Pertinent labs & imaging results that were available during my care of the patient were reviewed by me and considered in my medical decision making (see chart for details).    The patient has evidence of mild ischemia though the renal function could be the cause of the elevated troponin.  The CXR shows a possible pneumonia.  The blood counts are elevated consistent with sepsis, the lactic acid is elevated consistent with sepsis, blood cultures  have been ordered, urinalysis with cultures, antibiotics appropriate for sepsis.  The patient is not in shock based on blood pressure however the lactic acid being elevated will require IV fluids at 30 cc/kg, this will be rechecked  WBC elevated at 15,300, Renal function is 2.69 compared with baseline this is significantly elevated, troponin is elevated as well - consistent with NSTEMI - though with renal function, this could be accounted for.  Lactic acid is very elevated  D/w consultant Dr. Marily Memos - will admit.  Vitals:   05/24/17 1330 05/24/17 1335 05/24/17 1415  BP: 108/78    Pulse: 95  91  Resp: (!) 30    Temp: (!) 95.7 F (35.4 C)  (!) 96.8 F (36 C)  TempSrc: Axillary  Rectal  SpO2: 98%    Weight:  78 kg (172 lb)   Height:  5\' 10"  (1.778 m)      Final Clinical Impressions(s) / ED Diagnoses   Final diagnoses:  Sepsis, due to unspecified organism ([place])  AKI (acute kidney injury) ([place])  Altered mental status, unspecified altered mental status type    ED Discharge Orders    None       Noemi Chapel, MD 05/25/17 1017

## 2017-05-24 NOTE — ED Notes (Signed)
Bethena Roys (daughter) cell: [phone] please call with updates and questions

## 2017-05-24 NOTE — ED Notes (Signed)
Code sepsis called.

## 2017-05-24 NOTE — ED Notes (Signed)
Pt aware urine sample is needed, urinal bedside.

## 2017-05-24 NOTE — H&P (Signed)
History and Physical    Tyler Cameron RSW:[id number] DOB: 17-May-1941 DOA: 05/24/2017  PCP: Jonathon Jordan, MD Patient coming from: SNF  Chief Complaint: Weakness  HPI: Tyler Cameron is a 76 y.o. male with medical history significant of Afib w/ defibrillator in place, CAD, DM, HLD, Atrial thrombus.   Patient presenting from primary care physician's office due to EKG findings and description of symptoms as outlined below. Patient with approximately 1-2 week history of progressive generalized weakness, worsening somnolence, and sporadic episodes of dysuria and urinary urgency. Patient also complaining of intermittent generalized chest heaviness which is not associated with exertion, palpitations or true chest pain. Per family who is with patient at time of admission patient is becoming increasingly more sleepy and confused. PCP noted intermittent beats of PVCs and PACs. EMS was called to take patient from PCPs office at which time patient was noted to be in fibrillation.  ED Course: Patient started on subtraction azithromycin and given a 2.5 L bolus consistent with sepsis protocol.  Review of Systems: As per HPI otherwise all other systems reviewed and are negative  Ambulatory Status: unclear at time of admission  Past Medical History:  Diagnosis Date  . AICD (automatic cardioverter/defibrillator) present    Implant 08/02/12 Dr. Caryl Comes RV lead [employer] 7122 Q  serial #[id number].  [place] CD  [phone] Q , serial [id number]   . Atrial fibrillation ([place])    Diagnosed 2008, Tried Pradaxa and Xarelto that caused hematuria Refuses warfarin   . CAD (coronary artery disease) 07/30/2012   Inferior MI 2001 Stent to LAD and circ 2001 Refused cath 2008 in [place] Ischemic cardiomyopathy   . Diabetes mellitus type 2, noninsulin dependent ([place])   . Herpes simplex of eye   . Hyperlipidemia   . Hypertensive heart disease without CHF   . Ischemic cardiomyopathy 07/30/2012   Present since 2001  EKF 20% by ECHO 12/02/11   . Left atrial thrombus    He developed atrial fibrillation 2008 and was found to have a mass in his atrium on a CT scan. Confirmed by TEE that also showed spontaneous contrast.  He was placed on Pradaxa and had 2 TEE's  that eventually showed some reduction in the size of his thrombus but he refused to have a third TEE. He was taken off of Pradaxa and later placed on Xarelto but developed hematuria on Xarelto and quit taking this. He refused to consider going on warfarin and was willing to take the risks of stroke being on just aspirin alone     Past Surgical History:  Procedure Laterality Date  . ANAL FISSURECTOMY    . CARDIAC DEFIBRILLATOR PLACEMENT  08/02/12   St. Jude device  . CORNEAL TRANSPLANT    . CORONARY ANGIOPLASTY WITH STENT PLACEMENT  2001  . RETINAL DETACHMENT SURGERY      Social History   Socioeconomic History  . Marital status: Legally Separated    Spouse name: Not on file  . Number of children: Not on file  . Years of education: Not on file  . Highest education level: Not on file  Social Needs  . Financial resource strain: Not on file  . Food insecurity - worry: Not on file  . Food insecurity - inability: Not on file  . Transportation needs - medical: Not on file  . Transportation needs - non-medical: Not on file  Occupational History  . Not on file  Tobacco Use  . Smoking status: Current Some Day Smoker  Packs/day: 1.00    Years: 50.00    Pack years: 50.00    Types: Cigarettes  . Smokeless tobacco: Never Used  Substance and Sexual Activity  . Alcohol use: Yes    Alcohol/week: 1.5 oz    Types: 3 Standard drinks or equivalent per week  . Drug use: No  . Sexual activity: Not on file  Other Topics Concern  . Not on file  Social History Narrative   Divorced, lives alone  Retired Scientist, clinical (histocompatibility and immunogenetics).    Allergies  Allergen Reactions  . Lopid [Gemfibrozil]     "Ate away my muscles"  . Pradaxa [Dabigatran Etexilate Mesylate]  Other (See Comments)    unknown  . Xarelto [Rivaroxaban]     History reviewed. No pertinent family history.  Unable to obtain due to pts altered mental status - unknown  Prior to Admission medications   Medication Sig Start Date End Date Taking? Authorizing Provider  apixaban (ELIQUIS) 5 MG TABS tablet Take 1 tablet (5 mg total) by mouth 2 (two) times daily. 01/18/13  Yes Deboraha Sprang, MD  digoxin (LANOXIN) 0.125 MG tablet Take 1 tablet (0.125 mg total) by mouth daily. 01/18/13  Yes Deboraha Sprang, MD  furosemide (LASIX) 20 MG tablet Take 20 mg daily by mouth.    Yes [provider]  insulin aspart (NOVOLOG) 100 UNIT/ML injection Inject 5-8 Units See admin instructions into the skin. 8 units in the morning, 5 units in the evening   Yes [provider]  lisinopril (PRINIVIL,ZESTRIL) 2.5 MG tablet Take 1 tablet (2.5 mg total) by mouth daily. 01/18/13  Yes Deboraha Sprang, MD  metoprolol succinate (TOPROL-XL) 50 MG 24 hr tablet Take 1 tablet (50 mg total) by mouth 2 (two) times daily. Take with or immediately following a meal. 01/18/13  Yes Deboraha Sprang, MD  nitroGLYCERIN (NITROSTAT) 0.4 MG SL tablet Place 1 tablet (0.4 mg total) under the tongue every 5 (five) minutes x 3 doses as needed for chest pain. 08/03/12  Yes Jacolyn Reedy, MD  Sennosides (EX-LAX PO) Take 1 tablet as needed by mouth (constipation).   Yes [provider]    Physical Exam: Vitals:   05/24/17 1430 05/24/17 1445 05/24/17 1500 05/24/17 1545  BP: (!) 89/52 106/74 (!) 121/97 120/90  Pulse: 98  98 60  Resp: (!) 24 (!) 22 10 (!) 33  Temp:      TempSrc:      SpO2: 92%  99% 95%  Weight:      Height:         General: Frail and cachectic appearing, resting in bed Eyes:  PERRL, EOMI, normal lids, iris ENT: Dry mucous members, poor dentition Neck:  no LAD, masses or thyromegaly Cardiovascular: Irregularly irregular, trace bilateral lower extremity pitting edema. Respiratory: Wheezing and  rhonchi throughout. Increased effort. Abdomen:  soft, ntnd, NABS Skin:  no rash or induration seen on limited exam Musculoskeletal:  grossly normal tone BUE/BLE, good ROM, no bony abnormality Psychiatric: Intermittent periods of somnolence and mild confusion. Answers questions appropriately for the most part. Alert and oriented 3. Neurologic:  CN 2-12 grossly intact, moves all extremities in coordinated fashion, sensation intact  Labs on Admission: I have personally reviewed following labs and imaging studies  CBC: Recent Labs  Lab 05/24/17 1326 05/24/17 1428  WBC 15.3*  --   NEUTROABS 13.9*  --   HGB 15.7 15.6  HCT 48.9 46.0  MCV 93.7  --   PLT 200  --  Basic Metabolic Panel: Recent Labs  Lab 05/24/17 1326 05/24/17 1428  NA 135 135  K 5.3* 5.3*  CL 98* 102  CO2 22  --   GLUCOSE 193* 179*  BUN 110* 105*  CREATININE 2.69* 2.40*  CALCIUM 8.5*  --   MG 2.7*  --    GFR: Estimated Creatinine Clearance: 26.2 mL/min (A) (by C-G formula based on SCr of 2.4 mg/dL (H)). Liver Function Tests: Recent Labs  Lab 05/24/17 1326  AST 57*  ALT 57  ALKPHOS 114  BILITOT 1.9*  PROT 5.0*  ALBUMIN 2.7*   No results for input(s): LIPASE, AMYLASE in the last 168 hours. No results for input(s): AMMONIA in the last 168 hours. Coagulation Profile: No results for input(s): INR, PROTIME in the last 168 hours. Cardiac Enzymes: No results for input(s): CKTOTAL, CKMB, CKMBINDEX, TROPONINI in the last 168 hours. BNP (last 3 results) No results for input(s): PROBNP in the last 8760 hours. HbA1C: No results for input(s): HGBA1C in the last 72 hours. CBG: No results for input(s): GLUCAP in the last 168 hours. Lipid Profile: No results for input(s): CHOL, HDL, LDLCALC, TRIG, CHOLHDL, LDLDIRECT in the last 72 hours. Thyroid Function Tests: No results for input(s): TSH, T4TOTAL, FREET4, T3FREE, THYROIDAB in the last 72 hours. Anemia Panel: No results for input(s): VITAMINB12, FOLATE,  FERRITIN, TIBC, IRON, RETICCTPCT in the last 72 hours. Urine analysis:    Component Value Date/Time   COLORURINE YELLOW 05/24/2017 1711   APPEARANCEUR HAZY (A) 05/24/2017 1711   LABSPEC 1.016 05/24/2017 1711   PHURINE 5.0 05/24/2017 1711   GLUCOSEU NEGATIVE 05/24/2017 1711   HGBUR NEGATIVE 05/24/2017 1711   BILIRUBINUR NEGATIVE 05/24/2017 1711   KETONESUR NEGATIVE 05/24/2017 1711   PROTEINUR NEGATIVE 05/24/2017 1711   UROBILINOGEN 0.2 01/31/2015 2020   NITRITE NEGATIVE 05/24/2017 1711   LEUKOCYTESUR NEGATIVE 05/24/2017 1711    Creatinine Clearance: Estimated Creatinine Clearance: 26.2 mL/min (A) (by C-G formula based on SCr of 2.4 mg/dL (H)).  Sepsis Labs: @LABRCNTIP (procalcitonin:4,lacticidven:4) )No results found for this or any previous visit (from the past 240 hour(s)).   Radiological Exams on Admission: Dg Chest Port 1 View  Result Date: 05/24/2017 CLINICAL DATA:  Weakness and hyperglycemia EXAM: PORTABLE CHEST 1 VIEW COMPARISON:  January 31, 2015 FINDINGS: There is focal airspace opacity in the right mid lung consistent with pneumonia. Lungs elsewhere are clear. There is cardiomegaly with pulmonary vascularity within normal limits. Pacemaker lead is attached to the right ventricle. There is aortic atherosclerosis. No adenopathy. No bone lesions. IMPRESSION: Airspace opacity consistent with pneumonia in right mid lung. Lungs elsewhere clear. There is cardiomegaly with pacemaker lead attached to right ventricle. There is aortic atherosclerosis. Aortic Atherosclerosis (ICD10-I70.0). Followup PA and lateral chest radiographs recommended in 3-4 weeks following trial of antibiotic therapy to ensure resolution and exclude underlying malignancy. Electronically Signed   By: Lowella Grip III M.D.   On: 05/24/2017 13:45      Assessment/Plan Active Problems:   Atrial fibrillation ([place])   AICD (automatic cardioverter/defibrillator) present   Diabetes mellitus with complication  (H[facility])   Sepsis, unspecified organism ([place])   Respiratory failure ([place])   AKI (acute kidney injury) ([place])   Sepsis: respiratory vs urologic source. UA pending. CXR as above w/ possible pneumonia (RML). Lactic acid 5.49, WBC 15.3, intermittent somnolence, tachypnea, tachycardia, hypotension, hypoxemia. - Sepsis protocol - Continue CTX, Azithro - Treatment of PNeumonia as below  Acute hypoxic respiratory failure: likely from RML pneumonia and years of smoking w/ mild  COPD exacerbation.  - O2 prn - Pneuomonia protocol - CTX, Azithro - procalcitonin, Cultures, Respiratory viral panel - Decadron 10mg  x1, Duonebs Q6prn  AoCKD: Cr 2.69. Baseline 1.4.  - IVF - BMET in am  Chronic systolic congestive heart failure: Last echo showing an EF of 20%. No evidence of decalcified failure at this time. - I/O, daily weights - Continue digoxin, Lasix in a.m.  DM: - SSI  Afib: - cotninue home Eliquis, Digoxin - Dig level  Elevated troponin: 0.1.  likely from demand from sepsis and Afib w/ intermittent RVR.  - cycle trop - EKG in am - Cards consult if continues to rise.    HTN: - hold lisinopril until renal function improves - Resume Lasix in am - continue metoprolol  Tobacco: - continue nicotine patch   DVT prophylaxis: Eliquis  Code Status: DNR  Family Communication: daughter  Disposition Plan: pendingi mprovement  Consults called: none  Admission status: inpt - SDU    Waldemar Dickens MD [facility]  If 7PM-7AM, please contact night-coverage [email or web address] Password [facility]  05/24/2017, 6:32 PM

## 2017-05-24 NOTE — ED Triage Notes (Signed)
Pt arrives to ED from PCP office via EMS with complaints of generalized weakness since 2-3 months per family. Pt states he has a cardioverter/defibrillator that he has not felt go off. Pt glucose 263 per EMS. Pt placed in position of comfort with bed locked and lowered, call bell in reach. Family bedside

## 2017-05-24 NOTE — ED Notes (Signed)
I Stat Trop results shown to Dr. Sabra Heck

## 2017-05-24 NOTE — ED Notes (Signed)
I Stat Lactic Acid results shown to Dr. Sabra Heck

## 2017-05-24 NOTE — ED Notes (Signed)
Pt requesting nicotine patch and food at this time

## 2017-05-25 ENCOUNTER — Inpatient Hospital Stay (HOSPITAL_COMMUNITY): Payer: Medicare Other

## 2017-05-25 ENCOUNTER — Other Ambulatory Visit (HOSPITAL_COMMUNITY): Payer: Medicare Other

## 2017-05-25 DIAGNOSIS — I361 Nonrheumatic tricuspid (valve) insufficiency: Secondary | ICD-10-CM

## 2017-05-25 DIAGNOSIS — E118 Type 2 diabetes mellitus with unspecified complications: Secondary | ICD-10-CM

## 2017-05-25 LAB — COMPREHENSIVE METABOLIC PANEL
ALK PHOS: 123 U/L (ref 38–126)
ALT: 71 U/L — ABNORMAL HIGH (ref 17–63)
ANION GAP: 13 (ref 5–15)
AST: 71 U/L — ABNORMAL HIGH (ref 15–41)
Albumin: 2.7 g/dL — ABNORMAL LOW (ref 3.5–5.0)
BILIRUBIN TOTAL: 1.7 mg/dL — AB (ref 0.3–1.2)
BUN: 105 mg/dL — ABNORMAL HIGH (ref 6–20)
CALCIUM: 8 mg/dL — AB (ref 8.9–10.3)
CO2: 20 mmol/L — ABNORMAL LOW (ref 22–32)
Chloride: 102 mmol/L (ref 101–111)
Creatinine, Ser: 2.59 mg/dL — ABNORMAL HIGH (ref 0.61–1.24)
GFR, EST AFRICAN AMERICAN: 26 mL/min — AB (ref >60)
GFR, EST NON AFRICAN AMERICAN: 22 mL/min — AB (ref >60)
Glucose, Bld: 155 mg/dL — ABNORMAL HIGH (ref 65–99)
POTASSIUM: 5.2 mmol/L — AB (ref 3.5–5.1)
Sodium: 135 mmol/L (ref 135–145)
TOTAL PROTEIN: 4.9 g/dL — AB (ref 6.5–8.1)

## 2017-05-25 LAB — RESPIRATORY PANEL BY PCR
ADENOVIRUS-RVPPCR: NOT DETECTED
BORDETELLA PERTUSSIS-RVPCR: NOT DETECTED
CORONAVIRUS 229E-RVPPCR: NOT DETECTED
CORONAVIRUS HKU1-RVPPCR: NOT DETECTED
CORONAVIRUS NL63-RVPPCR: NOT DETECTED
CORONAVIRUS OC43-RVPPCR: NOT DETECTED
Chlamydophila pneumoniae: NOT DETECTED
Influenza A: NOT DETECTED
Influenza B: NOT DETECTED
METAPNEUMOVIRUS-RVPPCR: NOT DETECTED
Mycoplasma pneumoniae: NOT DETECTED
PARAINFLUENZA VIRUS 1-RVPPCR: NOT DETECTED
PARAINFLUENZA VIRUS 2-RVPPCR: NOT DETECTED
PARAINFLUENZA VIRUS 3-RVPPCR: NOT DETECTED
Parainfluenza Virus 4: NOT DETECTED
Respiratory Syncytial Virus: NOT DETECTED
Rhinovirus / Enterovirus: NOT DETECTED

## 2017-05-25 LAB — MRSA PCR SCREENING: MRSA BY PCR: NEGATIVE

## 2017-05-25 LAB — CBC
HEMATOCRIT: 49.7 % (ref 39.0–52.0)
HEMOGLOBIN: 16.2 g/dL (ref 13.0–17.0)
MCH: 30.4 pg (ref 26.0–34.0)
MCHC: 32.6 g/dL (ref 30.0–36.0)
MCV: 93.2 fL (ref 78.0–100.0)
Platelets: 198 10*3/uL (ref 150–400)
RBC: 5.33 MIL/uL (ref 4.22–5.81)
RDW: 16 % — AB (ref 11.5–15.5)
WBC: 18.9 10*3/uL — ABNORMAL HIGH (ref 4.0–10.5)

## 2017-05-25 LAB — ECHOCARDIOGRAM COMPLETE
HEIGHTINCHES: 70 in
WEIGHTICAEL: 2867.2 [oz_av]

## 2017-05-25 LAB — TROPONIN I
TROPONIN I: 0.1 ng/mL — AB (ref <0.03)
Troponin I: 0.13 ng/mL (ref <0.03)

## 2017-05-25 LAB — LACTIC ACID, PLASMA: Lactic Acid, Venous: 2.3 mmol/L (ref 0.5–1.9)

## 2017-05-25 LAB — CBG MONITORING, ED
GLUCOSE-CAPILLARY: 158 mg/dL — AB (ref 65–99)
Glucose-Capillary: 146 mg/dL — ABNORMAL HIGH (ref 65–99)

## 2017-05-25 LAB — GLUCOSE, CAPILLARY
GLUCOSE-CAPILLARY: 140 mg/dL — AB (ref 65–99)
GLUCOSE-CAPILLARY: 130 mg/dL — AB (ref 65–99)
Glucose-Capillary: 131 mg/dL — ABNORMAL HIGH (ref 65–99)

## 2017-05-25 MED ORDER — METOPROLOL SUCCINATE ER 25 MG PO TB24
12.5000 mg | ORAL_TABLET | Freq: Two times a day (BID) | ORAL | Status: DC
  Administered 2017-05-25 – 2017-05-26 (×2): 12.5 mg via ORAL
  Filled 2017-05-25 (×2): qty 1

## 2017-05-25 MED ORDER — NICOTINE 14 MG/24HR TD PT24
14.0000 mg | MEDICATED_PATCH | TRANSDERMAL | Status: DC
  Administered 2017-05-25 – 2017-05-26 (×2): 14 mg via TRANSDERMAL
  Filled 2017-05-25 (×2): qty 1

## 2017-05-25 NOTE — Progress Notes (Addendum)
Patient had 8 beats of vtach. In no distress, BP 98/69 (80). Magnesium 2.7 and potassium 5.2. Will continue to monitor.

## 2017-05-25 NOTE — Progress Notes (Signed)
[place] TEAM 1 - Stepdown/ICU TEAM  Tyler Cameron  QBH:[id number] DOB: [birth year] DOA: 05/24/2017 PCP: Jonathon Jordan, MD    Brief Narrative:  76 y.o. male with a history of Afib, AICD, CAD, DM, HLD, and Atrial thrombus who presented from his primary care physician's office due to progressive generalized weakness, chest heaviness, worsening somnolence, and sporadic episodes of dysuria and urinary urgency.  PCP noted intermittent beats of PVCs and PACs. EMS was called to take patient from PCPs office at which time patient was noted to be in atrial fibrillation.  Significant Events: 11/12 admit via ED  Subjective: Resting comfortably in bed.  Alert and conversant, but mildly confused.  No acute distress.  Denies cp, n/v, or abdom pain.    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 Caryl Comes - rate control improved following volume resuscitation   CAD - mildly Elevated troponin  S/p stend to LAD and circ 2001 - LHC 1/14 demonstrated patency of stent to LAD and the circumflex with occlusion of the right coronary artery with collaterals  Ischemic chronic systolic CHF s/p AICD OX73% at baseline - f/u TTE pending - follow daily weights and Is/Os - wgt in 2014 was 81.6kg Filed Weights   05/24/17 1335 05/25/17 1122  Weight: 78 kg (172 lb) 81.3 kg (179 lb 3.2 oz)    DM2 CBG well controlled at this time   HLD  HTN Presently  mildly hypotensive - follow trend w/ volume resuscitation   DVT prophylaxis: eliquis  Code Status: DNR - NO CODE Family Communication: no family present at time of exam  Disposition Plan: SDU   Consultants:  None   Antimicrobials:  Azithromycin 11/12> Ceftriaxone 11/12 >  Objective: Blood pressure 111/73, pulse (!) 50, temperature (!) 97.4 F (36.3 C), temperature source Axillary, resp. rate (!) 30, height 5\' 10"  (1.778 m), weight 81.3 kg (179 lb 3.2 oz), SpO2 96 %. No intake or output data in the 24 hours ending 05/25/17 1345 Filed Weights   05/24/17 1335 05/25/17 1122  Weight: 78 kg (172 lb) 81.3 kg (179 lb 3.2 oz)    Examination: General: No acute respiratory distress at rest in bed  Lungs: crackles in R  Cardiovascular: irreg irreg - rate controlled - no appreciable M Abdomen: Nontender, nondistended, soft, bowel sounds positive, no rebound, no ascites, no appreciable mass Extremities: 1+ B LE edema   CBC: Recent Labs  Lab 05/24/17 1326 05/24/17 1428 05/25/17 0550  WBC 15.3*  --  18.9*  NEUTROABS 13.9*  --   --   HGB 15.7 15.6 16.2  HCT 48.9 46.0 49.7  MCV 93.7  --  93.2  PLT 200  --  532   Basic Metabolic Panel: Recent Labs  Lab 05/24/17 1326 05/24/17 1428 05/25/17 0550  NA 135 135 135  K 5.3* 5.3* 5.2*  CL 98* 102 102  CO2 22  --  20*  GLUCOSE 193* 179* 155*  BUN 110* 105* 105*  CREATININE 2.69* 2.40* 2.59*  CALCIUM 8.5*  --  8.0*  MG 2.7*  --   --    GFR: Estimated Creatinine Clearance: 24.3 mL/min (A) (by C-G formula based on SCr of 2.59 mg/dL (H)).  Liver Function Tests: Recent Labs  Lab 05/24/17 1326 05/25/17 0550  AST 57* 71*  ALT 57 71*  ALKPHOS 114 123  BILITOT 1.9* 1.7*  PROT 5.0* 4.9*  ALBUMIN 2.7* 2.7*   Cardiac Enzymes: Recent Labs  Lab 05/24/17 1955 05/25/17 0222 05/25/17 0550  TROPONINI 0.11* 0.10* 0.13*    HbA1C: Hgb A1c MFr Bld  Date/Time Value Ref Range Status  07/30/2012 12:50 PM 7.6 (H) <5.7 % Final     Comment:    (NOTE)                                                                       According to the ADA Clinical Practice Recommendations for 2011, when HbA1c is used as a screening test:  >=6.5%   Diagnostic of Diabetes Mellitus           (if abnormal result is confirmed) 5.7-6.4%   Increased risk of developing Diabetes Mellitus References:Diagnosis and Classification of Diabetes Mellitus,Diabetes SWFU,9323,55(DDUKG 1):S62-S69 and Standards of Medical Care in         Diabetes - 2011,Diabetes URKY,7062,37 (Suppl 1):S11-S61.    CBG: Recent Labs  Lab 05/25/17 0100 05/25/17 0819 05/25/17 1107  GLUCAP 158* 146* 140*    Recent Results (from the past 240 hour(s))  Blood Culture (routine x 2)     Status: None (Preliminary result)   Collection Time: 05/24/17  3:28 PM  Result Value Ref Range Status   Specimen Description BLOOD RIGHT WRIST  Final   Special Requests   Final    BOTTLES DRAWN AEROBIC AND ANAEROBIC Blood Culture adequate volume   Culture NO GROWTH < 24 HOURS  Final   Report Status PENDING  Incomplete  Blood Culture (routine x 2)     Status: None (Preliminary result)   Collection Time: 05/24/17  4:11 PM  Result Value Ref Range Status   Specimen Description BLOOD LEFT HAND  Final   Special Requests   Final    BOTTLES DRAWN AEROBIC ONLY Blood Culture results may not be optimal due to an inadequate volume of blood received in culture bottles   Culture NO GROWTH < 24 HOURS  Final   Report Status PENDING  Incomplete  Culture, respiratory (NON-Expectorated)     Status: None (Preliminary result)   Collection Time: 05/24/17  7:52 PM  Result Value Ref Range Status   Specimen Description SPUTUM  Final   Special Requests NONE  Final   Gram Stain   Final    RARE WBC PRESENT, PREDOMINANTLY PMN FEW SQUAMOUS EPITHELIAL CELLS PRESENT MODERATE GRAM POSITIVE COCCI FEW GRAM NEGATIVE RODS    Culture CULTURE REINCUBATED FOR BETTER GROWTH  Final   Report Status PENDING   Incomplete  MRSA PCR Screening     Status: None   Collection Time: 05/25/17 10:58 AM  Result Value Ref Range Status   MRSA by PCR NEGATIVE NEGATIVE Final    Comment:  The GeneXpert MRSA Assay (FDA approved for NASAL specimens only), is one component of a comprehensive MRSA colonization surveillance program. It is not intended to diagnose MRSA infection nor to guide or monitor treatment for MRSA infections.      Scheduled Meds: . apixaban  5 mg Oral BID  . dexamethasone  10 mg Intravenous Once  . digoxin  0.125 mg Oral Daily  . furosemide  20 mg Oral Daily  . insulin aspart  0-5 Units Subcutaneous QHS  . insulin aspart  0-9 Units Subcutaneous TID WC  . metoprolol succinate  50 mg Oral BID  . nicotine  14 mg Transdermal Once   Continuous Infusions: . sodium chloride Stopped (05/24/17 1838)  . sodium chloride 75 mL/hr at 05/25/17 1100  . azithromycin    . cefTRIAXone (ROCEPHIN)  IV       LOS: 1 day   Cherene Altes, MD [facility] Office  [phone] Pager - Text Page per Amion as per below:  On-Call/Text Page:      [email or web address]      password [email or web address]  If 7PM-7AM, please contact night-coverage [email or web address] Password [email or web address] 05/25/2017, 1:45 PM

## 2017-05-25 NOTE — Progress Notes (Signed)
  Echocardiogram 2D Echocardiogram has been performed.  Tyler Cameron T Wen [place] 05/25/2017, 2:43 PM

## 2017-05-26 DIAGNOSIS — Z7189 Other specified counseling: Secondary | ICD-10-CM

## 2017-05-26 DIAGNOSIS — Z515 Encounter for palliative care: Secondary | ICD-10-CM

## 2017-05-26 DIAGNOSIS — L8990 Pressure ulcer of unspecified site, unspecified stage: Secondary | ICD-10-CM | POA: Diagnosis present

## 2017-05-26 LAB — CBC
HCT: 49.1 % (ref 39.0–52.0)
Hemoglobin: 15.8 g/dL (ref 13.0–17.0)
MCH: 29.7 pg (ref 26.0–34.0)
MCHC: 32.2 g/dL (ref 30.0–36.0)
MCV: 92.3 fL (ref 78.0–100.0)
PLATELETS: 187 10*3/uL (ref 150–400)
RBC: 5.32 MIL/uL (ref 4.22–5.81)
RDW: 15.8 % — AB (ref 11.5–15.5)
WBC: 16.1 10*3/uL — AB (ref 4.0–10.5)

## 2017-05-26 LAB — COMPREHENSIVE METABOLIC PANEL
ALT: 64 U/L — AB (ref 17–63)
AST: 52 U/L — AB (ref 15–41)
Albumin: 2.6 g/dL — ABNORMAL LOW (ref 3.5–5.0)
Alkaline Phosphatase: 122 U/L (ref 38–126)
Anion gap: 10 (ref 5–15)
BILIRUBIN TOTAL: 1.5 mg/dL — AB (ref 0.3–1.2)
BUN: 100 mg/dL — AB (ref 6–20)
CALCIUM: 8 mg/dL — AB (ref 8.9–10.3)
CHLORIDE: 104 mmol/L (ref 101–111)
CO2: 20 mmol/L — ABNORMAL LOW (ref 22–32)
CREATININE: 2.27 mg/dL — AB (ref 0.61–1.24)
GFR, EST AFRICAN AMERICAN: 30 mL/min — AB (ref >60)
GFR, EST NON AFRICAN AMERICAN: 26 mL/min — AB (ref >60)
Glucose, Bld: 130 mg/dL — ABNORMAL HIGH (ref 65–99)
Potassium: 4.7 mmol/L (ref 3.5–5.1)
Sodium: 134 mmol/L — ABNORMAL LOW (ref 135–145)
TOTAL PROTEIN: 4.8 g/dL — AB (ref 6.5–8.1)

## 2017-05-26 LAB — HIV ANTIBODY (ROUTINE TESTING W REFLEX): HIV Screen 4th Generation wRfx: UNDETERMINED

## 2017-05-26 LAB — GLUCOSE, CAPILLARY
GLUCOSE-CAPILLARY: 121 mg/dL — AB (ref 65–99)
GLUCOSE-CAPILLARY: 148 mg/dL — AB (ref 65–99)
Glucose-Capillary: 178 mg/dL — ABNORMAL HIGH (ref 65–99)
Glucose-Capillary: 173 mg/dL — ABNORMAL HIGH (ref 65–99)

## 2017-05-26 LAB — LACTIC ACID, PLASMA: LACTIC ACID, VENOUS: 1.4 mmol/L (ref 0.5–1.9)

## 2017-05-26 LAB — LEGIONELLA PNEUMOPHILA SEROGP 1 UR AG: L. pneumophila Serogp 1 Ur Ag: NEGATIVE

## 2017-05-26 MED ORDER — ACETAMINOPHEN 325 MG PO TABS: 650.0000 mg | ORAL_TABLET | Freq: Four times a day (QID) | ORAL | Status: DC | PRN

## 2017-05-26 MED ORDER — LORAZEPAM 2 MG/ML IJ SOLN: 0.5000 mg | Freq: Four times a day (QID) | INTRAMUSCULAR | Status: DC | PRN

## 2017-05-26 MED ORDER — LORAZEPAM 2 MG/ML IJ SOLN: 0.5000 mg | INTRAMUSCULAR | Status: DC | PRN

## 2017-05-26 MED ORDER — LEVALBUTEROL HCL 0.63 MG/3ML IN NEBU: 0.6300 mg | INHALATION_SOLUTION | RESPIRATORY_TRACT | Status: DC | PRN

## 2017-05-26 MED ORDER — AZITHROMYCIN 250 MG PO TABS
250.0000 mg | ORAL_TABLET | Freq: Every day | ORAL | Status: DC
  Administered 2017-05-27: 250 mg via ORAL
  Filled 2017-05-26: qty 1

## 2017-05-26 MED ORDER — ACETAMINOPHEN 325 MG PO TABS
650.0000 mg | ORAL_TABLET | Freq: Four times a day (QID) | ORAL | Status: DC | PRN
  Administered 2017-05-26: 650 mg via ORAL
  Filled 2017-05-26: qty 2

## 2017-05-26 MED ORDER — SODIUM CHLORIDE 0.9 % IV BOLUS (SEPSIS)
500.0000 mL | Freq: Once | INTRAVENOUS | Status: AC
  Administered 2017-05-26: 500 mL via INTRAVENOUS

## 2017-05-26 MED ORDER — CEFUROXIME AXETIL 500 MG PO TABS
500.0000 mg | ORAL_TABLET | Freq: Two times a day (BID) | ORAL | Status: DC
  Administered 2017-05-27 (×2): 500 mg via ORAL
  Filled 2017-05-26 (×2): qty 1

## 2017-05-26 NOTE — Consult Note (Signed)
            [facility] CM Primary Care Navigator  05/26/2017  Tyler Cameron 16-Feb-1941 [id number]   Went to see patient at the bedsideto identify possible discharge needs. Patientreports having "increased weakness, tiredness and lack of endurance" whichhad led to this admission.  Patientendorses Dr. Jonathon Jordan with [facility] as [place] care provider.   Patient verbalized using [facility] on [address], [place] and [employer] order serviceto obtain medications without difficulty.   Patient reportsmanaginghis ownmedications at home straight out of the containers.  Patient statesthat daughter Charlett Nose) has been providingtransportation to his doctors'appointments.  Patient verbalized that he lives alone but daughter lives three miles away from him. He states that daughter will provide assistance with his care needs at home.  Anticipateddischarge plan is home with home health services according to patient.  Patientvoiced understanding to call primary care provider's officewhen hereturnshome,for a post discharge follow-up within a week or sooner if needs arise.Patient letter (with PCP's contact number) was provided as areminder.   Discussed with patient regarding [facility] CM services available for health management at home but he verbalized decline ofservices. Patient had refused[facility] services which wasoffered to him on different times including EMMI calls to follow-up his recovery.   He mentioned that he is managing at home so far. MD note states that blood sugars are well-controlled at this time.   Encouraged patientto seekreferral from primary care provider to [facility] care management if he changes his mind and if deemednecessaryand appropriate for services in the near future.  [facility] care management information was provided for future needs that he may have.  Primary care provider's office is listed  as providing transition of care (TOC).   For additional questions please contact:  Edwena Felty A. Zeph Riebel, BSN, RN-BC [facility] PRIMARY CARE Navigator Cell: [phone]

## 2017-05-26 NOTE — Progress Notes (Signed)
Patient had NSVT asymptomatic, HR seen in the 100s but comes down quickly to normal levels.  Patient at times is forgetful, disoriented, but is able to follow commands.   No complains of pain.

## 2017-05-26 NOTE — Consult Note (Signed)
Consultation Note Date: 05/26/2017   Patient Name: Tyler Cameron  DOB: [birth year]  MRN: [id number]  Age / Sex: 76 y.o., male  PCP: Tyler Jordan, MD Referring Physician: Cherene Altes, MD  Reason for Consultation: Establishing goals of care  HPI/Patient Profile: 76 y.o. male  with past medical history of CHF, a. Fib, AICD, CAD, DM, atrial thrombus  admitted on 05/24/2017 with sepsis r/t pneumonia. During admission has had some ectopy r/t cardiac stress from illness. Not requiring oxygen. ECHO shows EF 15-20%. He was admitted from home due to being very weak. Per his daughter's report he was unable to walk to the car. This morning he became angry and upset, stating he wanted to be discharged. Palliative medicine consulted for Tyler Cameron.    Clinical Assessment and Goals of Care:  I have reviewed medical records including EPIC notes, labs and imaging, received report from [place], Therapist, sports, assessed the patient and then met at the bedside along with the patient  to discuss diagnosis prognosis, [place], EOL wishes, disposition and options.  I introduced Palliative Medicine as specialized medical care for people living with serious illness. It focuses on providing relief from the symptoms and stress of a serious illness. The goal is to improve quality of life for both the patient and the family.  We discussed a brief life review of the patient. He is from [place]. He is retired from [employer] where he worked in [employer].   As far as functional and nutritional status prior to admission he was living at home alone. He was completing his own ADL's and making his own simple meals.    We discussed their current illness and what it means in the larger context of their on-going co-morbidities.  Natural disease trajectory and expectations at EOL were discussed. He tells me that five years ago he made the decision to focus on  "quality not quantity". We discussed his heart failure and the possible recurrence of exacerbations. He feels that being in the hospital does not contribute to his quality of life. He expressly states, "I would rather die, than be in the hospital".   I attempted to elicit values and goals of care important to the patient. Being home, and being with his family is very important to him. His independence is important to him.    The difference between aggressive medical intervention and comfort care was considered in light of the patient's goals of care.   Advanced directives, concepts specific to code status, artifical feeding and hydration, and rehospitalization were considered and discussed. He already has a DNR in place. Tyler Cameron states that if he were at home he would not want to return for the hospital even for IV antibiotics, even if that meant he would die.   Hospice and Palliative Care services outpatient were explained and offered. Tyler Cameron states he would be open to Hospice services "in 10 days" after Thanksgiving is finished.    Primary Decision Maker PATIENT    SUMMARY OF RECOMMENDATIONS - DIscussed with  Tyler Cameron- patient is likely eligible for Hospice if he and family would accept referral- however, doubt that home admission could be arranged before discharge today -He is competent and understands the repercussions of his decisions  -Tyler Cameron to call and discuss with patient's daughter- Tyler Cameron -If patient agrees to stay overnight PMT can meet with patient and daughter as previously scheduled at [place]:  DNR  Prognosis:    < 6 months d/t CHF, sepsis, deconditioning, pneumonia, pt desire to remain home and not return to hospital if has recurrent infection or CHF exacerbation  Discharge Planning: Home with Home Health vs Hospice vs discharge AMA  Primary Diagnoses: Present on Admission: . AICD (automatic cardioverter/defibrillator)  present . Atrial fibrillation ([place])   I have reviewed the medical record, interviewed the patient and family, and examined the patient. The following aspects are pertinent.  Past Medical History:  Diagnosis Date  . AICD (automatic cardioverter/defibrillator) present    Implant 08/02/12 Dr. Caryl Comes RV lead [employer] Jude 7122 Q  serial #[id number].  [place] CD  [phone] Q , serial [id number]   . Atrial fibrillation ([place])    Diagnosed 2008, Tried Pradaxa and Xarelto that caused hematuria Refuses warfarin   . CAD (coronary artery disease) 07/30/2012   Inferior MI 2001 Stent to LAD and circ 2001 Refused cath 2008 in [place] Ischemic cardiomyopathy   . Diabetes mellitus type 2, noninsulin dependent ([place])   . Herpes simplex of eye   . Hyperlipidemia   . Hypertensive heart disease without CHF   . Ischemic cardiomyopathy 07/30/2012   Present since 2001 EKF 20% by ECHO 12/02/11   . Left atrial thrombus    He developed atrial fibrillation 2008 and was found to have a mass in his atrium on a CT scan. Confirmed by TEE that also showed spontaneous contrast.  He was placed on Pradaxa and had 2 TEE's  that eventually showed some reduction in the size of his thrombus but he refused to have a third TEE. He was taken off of Pradaxa and later placed on Xarelto but developed hematuria on Xarelto and quit taking this. He refused to consider going on warfarin and was willing to take the risks of stroke being on just aspirin alone    Social History   Socioeconomic History  . Marital status: Legally Separated    Spouse name: None  . Number of children: None  . Years of education: None  . Highest education level: None  Social Needs  . Financial resource strain: None  . Food insecurity - worry: None  . Food insecurity - inability: None  . Transportation needs - medical: None  . Transportation needs - non-medical: None  Occupational History  . None  Tobacco Use  . Smoking status: Current Some Day Smoker     Packs/day: 1.00    Years: 50.00    Pack years: 50.00    Types: Cigarettes  . Smokeless tobacco: Never Used  Substance and Sexual Activity  . Alcohol use: Yes    Alcohol/week: 1.5 oz    Types: 3 Standard drinks or equivalent per week  . Drug use: No  . Sexual activity: None  Other Topics Concern  . None  Social History Narrative   Divorced, lives alone  Retired Scientist, clinical (histocompatibility and immunogenetics).   History reviewed. No pertinent family history. Scheduled Meds: . apixaban  5 mg Oral BID  . digoxin  0.125 mg Oral Daily  . insulin aspart  0-5 Units Subcutaneous QHS  . insulin aspart  0-9 Units Subcutaneous TID WC  . metoprolol succinate  12.5 mg Oral BID  . nicotine  14 mg Transdermal Q24H   Continuous Infusions: . sodium chloride 75 mL/hr at 05/26/17 0349  . azithromycin Stopped (05/25/17 1843)  . cefTRIAXone (ROCEPHIN)  IV Stopped (05/25/17 1640)   PRN Meds:.acetaminophen, ipratropium-albuterol, LORazepam, ondansetron **OR** ondansetron (ZOFRAN) IV, senna Medications Prior to Admission:  Prior to Admission medications   Medication Sig Start Date End Date Taking? Authorizing Provider  apixaban (ELIQUIS) 5 MG TABS tablet Take 1 tablet (5 mg total) by mouth 2 (two) times daily. 01/18/13  Yes Deboraha Sprang, MD  digoxin (LANOXIN) 0.125 MG tablet Take 1 tablet (0.125 mg total) by mouth daily. 01/18/13  Yes Deboraha Sprang, MD  furosemide (LASIX) 20 MG tablet Take 20 mg daily by mouth.    Yes [provider]  insulin aspart (NOVOLOG) 100 UNIT/ML injection Inject 5-8 Units See admin instructions into the skin. 8 units in the morning, 5 units in the evening   Yes [provider]  lisinopril (PRINIVIL,ZESTRIL) 2.5 MG tablet Take 1 tablet (2.5 mg total) by mouth daily. 01/18/13  Yes Deboraha Sprang, MD  metoprolol succinate (TOPROL-XL) 50 MG 24 hr tablet Take 1 tablet (50 mg total) by mouth 2 (two) times daily. Take with or immediately following a meal. 01/18/13  Yes Deboraha Sprang, MD    nitroGLYCERIN (NITROSTAT) 0.4 MG SL tablet Place 1 tablet (0.4 mg total) under the tongue every 5 (five) minutes x 3 doses as needed for chest pain. 08/03/12  Yes Jacolyn Reedy, MD  Sennosides (EX-LAX PO) Take 1 tablet as needed by mouth (constipation).   Yes [provider]   Allergies  Allergen Reactions  . Lopid [Gemfibrozil]     "Ate away my muscles"  . Pradaxa [Dabigatran Etexilate Mesylate] Other (See Comments)    unknown  . Xarelto [Rivaroxaban]    Review of Systems  Constitutional: Positive for activity change, appetite change and fatigue.  Respiratory: Positive for shortness of breath.   Psychiatric/Behavioral: Positive for dysphoric mood and sleep disturbance.    Physical Exam  Constitutional: He is oriented to person, place, and time.  Thin, frail  HENT:  Poor dentition  Neurological: He is alert and oriented to person, place, and time.  Nursing note and vitals reviewed.   Vital Signs: BP 101/77   Pulse 97   Temp 97.7 F (36.5 C) (Oral)   Resp (!) 22   Ht 5' 10"  (1.778 m)   Wt 82.5 kg (181 lb 12.8 oz)   SpO2 96%   BMI 26.09 kg/m  Pain Assessment: No/denies pain POSS *See Group Information*: 1-Acceptable,Awake and alert     SpO2: SpO2: 96 % O2 Device:SpO2: 96 % O2 Flow Rate: .O2 Flow Rate (L/min): 2 L/min  IO: Intake/output summary:   Intake/Output Summary (Last 24 hours) at 05/26/2017 1621 Last data filed at 05/26/2017 1200 Gross per 24 hour  Intake 3338.75 ml  Output 600 ml  Net 2738.75 ml    LBM: Last BM Date: 05/25/17 Baseline Weight: Weight: 78 kg (172 lb) Most recent weight: Weight: 82.5 kg (181 lb 12.8 oz)     Palliative Assessment/Data: PPS: 60%     Thank you for this consult. Palliative medicine will continue to follow and assist as needed.   Time In: 1430 Time Out: 1630 Time Total: 120 mins Greater than 50%  of this time was spent  counseling and coordinating care related to the above assessment and plan.  Signed  by: Mariana Kaufman, AGNP-C Palliative Medicine    Please contact Palliative Medicine Team phone at [phone] for questions and concerns.  For individual provider: See Shea Evans

## 2017-05-26 NOTE — Progress Notes (Signed)
[name] rhythm have been NSVT and wide QRS, with a period of 16 beats VT as he got up to use the urinal and being weighted.  Patient is asymptomatic, still showing signs of confusion at times.  He is able to follow commands as requested by staff.

## 2017-05-26 NOTE — Progress Notes (Addendum)
After voiding/having BM on commode, pt became dizzy and was assisted to the bed by RN and NT. Pt stated that he had been straining to void. Initial BP 86/63. BP up to 95/80 after resting in the bed for 10 minutes. Bed alarm on; call bell within reach.  Upon reassessment of patient about 45 minutes after initial episode, patient found to be sleepy with BP 88/54. Patient lying in bed resting at this time. Paged MD Thereasa Solo with this information.

## 2017-05-26 NOTE — Progress Notes (Signed)
[place] TEAM 1 - Stepdown/ICU TEAM  Conroy Goracke  FBP:[id number] DOB: Nov 29, 1940 DOA: 05/24/2017 PCP: Jonathon Jordan, MD    Brief Narrative:  76 y.o. male with a history of Afib, AICD, CAD, DM, HLD, and Atrial thrombus who presented from his primary care physician's office due to progressive generalized weakness, chest heaviness, worsening somnolence, and sporadic episodes of dysuria and urinary urgency.  PCP noted intermittent beats of PVCs and PACs. EMS was called to take patient from PCPs office at which time patient was noted to be in atrial fibrillation.  Significant Events: 11/12 admit via ED  Subjective: Patient is sitting up in a bedside chair.  He states he feels much better.  He denies chest pain nausea vomiting or abdominal pain.  He is adamant that he is going home today.  He has not yet been up ambulating to any significant extent.  Physical therapy and occupational therapy have not seen him.  He is alert and oriented.  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 Caryl Comes - rate control improved following volume resuscitation and is stable presently   CAD - mildly Elevated troponin  S/p stent to LAD and circ 2001 - LHC 1/14 demonstrated patency of stent to LAD and the circumflex with occlusion of the right  coronary artery with collaterals - no chest pain   Ischemic chronic systolic CHF s/p AICD OE42% at baseline - f/u TTE this admit notes EF 15-20% w/ no WMA - follow daily weights and Is/Os - wgt in 2014 was 81.6kg - no severe overload on exam at this time  [facility] Weights   05/24/17 1335 05/25/17 1122 05/26/17 0459  Weight: 78 kg (172 lb) 81.3 kg (179 lb 3.2 oz) 82.5 kg (181 lb 12.8 oz)    DM2 CBG well controlled at this time   HLD  HTN Presently mildly hypotensive - follow trend w/ stopping of IVF - holding select home meds for now   DVT prophylaxis: eliquis  Code Status: DNR - NO CODE Family Communication: spoke w/ duaghter via phone at length  Disposition Plan: the pt is adamant that he is going home today - he is competent in my judgement - I have counseled him that I would like for him to stay one additional night to allow further monitoring of his BP, and to allow [place] to have PT/OT, and Palliative Care f/u with him to assure maximal assist at home - he is thinking over his options - if he decides to go home I will not make him leave AMA, but I do still strongly advise he stay until at least 11/15  Consultants:  None   Antimicrobials:  Azithromycin 11/12> Ceftriaxone 11/12 >  Objective: Blood pressure 101/77, pulse 97, temperature  97.7 F (36.5 C), temperature source Oral, resp. rate (!) 22, height 5\' 10"  (1.778 m), weight 82.5 kg (181 lb 12.8 oz), SpO2 96 %.  Intake/Output Summary (Last 24 hours) at 05/26/2017 1532 Last data filed at 05/26/2017 1200 Gross per 24 hour  Intake 3338.75 ml  Output 600 ml  Net 2738.75 ml   Filed Weights   05/24/17 1335 05/25/17 1122 05/26/17 0459  Weight: 78 kg (172 lb) 81.3 kg (179 lb 3.2 oz) 82.5 kg (181 lb 12.8 oz)    Examination: General: No acute respiratory distress - alert and oriented  Lungs: crackles in R mid fields - no wheezing  Cardiovascular: irreg irreg w/o M or rub Abdomen: Nontender, nondistended, soft, bowel sounds  positive, Extremities: 1+ B LE edema w/o signif change   CBC: Recent Labs  Lab 05/24/17 1326 05/24/17 1428 05/25/17 0550 05/26/17 0405  WBC 15.3*  --  18.9* 16.1*  NEUTROABS 13.9*  --   --   --   HGB 15.7 15.6 16.2 15.8  HCT 48.9 46.0 49.7 49.1  MCV 93.7  --  93.2 92.3  PLT 200  --  198 258   Basic Metabolic Panel: Recent Labs  Lab 05/24/17 1326 05/24/17 1428 05/25/17 0550 05/26/17 0405  NA 135 135 135 134*  K 5.3* 5.3* 5.2* 4.7  CL 98* 102 102 104  CO2 22  --  20* 20*  GLUCOSE 193* 179* 155* 130*  BUN 110* 105* 105* 100*  CREATININE 2.69* 2.40* 2.59* 2.27*  CALCIUM 8.5*  --  8.0* 8.0*  MG 2.7*  --   --   --    GFR: Estimated Creatinine Clearance: 27.7 mL/min (A) (by C-G formula based on SCr of 2.27 mg/dL (H)).  Liver Function Tests: Recent Labs  Lab 05/24/17 1326 05/25/17 0550 05/26/17 0405  AST 57* 71* 52*  ALT 57 71* 64*  ALKPHOS 114 123 122  BILITOT 1.9* 1.7* 1.5*  PROT 5.0* 4.9* 4.8*  ALBUMIN 2.7* 2.7* 2.6*   Cardiac Enzymes: Recent Labs  Lab 05/24/17 1955 05/25/17 0222 05/25/17 0550  TROPONINI 0.11* 0.10* 0.13*    HbA1C: Hgb A1c MFr Bld  Date/Time Value Ref Range Status  07/30/2012 12:50 PM 7.6 (H) <5.7 % Final    Comment:    (NOTE)                                                                       According to the ADA Clinical Practice Recommendations for 2011, when HbA1c is used as a screening test:  >=6.5%   Diagnostic of Diabetes Mellitus           (if abnormal result is confirmed) 5.7-6.4%   Increased risk of developing Diabetes Mellitus References:Diagnosis and Classification of Diabetes Mellitus,Diabetes NIDP,8242,35(TIRWE 1):S62-S69 and Standards of Medical Care in         Diabetes - 2011,Diabetes RXVQ,0086,76 (Suppl 1):S11-S61.    CBG: Recent Labs  Lab 05/25/17 1107 05/25/17 1627 05/25/17 2125 05/26/17 0834 05/26/17 1119  GLUCAP 140* 130* 131* 121* 148*    Recent Results (from the past 240 hour(s))  Blood  Culture (routine x 2)     Status: None (Preliminary result)   Collection Time: 05/24/17  3:28 PM  Result Value Ref  Range Status   Specimen Description BLOOD RIGHT WRIST  Final   Special Requests   Final    BOTTLES DRAWN AEROBIC AND ANAEROBIC Blood Culture adequate volume   Culture NO GROWTH 2 DAYS  Final   Report Status PENDING  Incomplete  Blood Culture (routine x 2)     Status: None (Preliminary result)   Collection Time: 05/24/17  4:11 PM  Result Value Ref Range Status   Specimen Description BLOOD LEFT HAND  Final   Special Requests   Final    BOTTLES DRAWN AEROBIC ONLY Blood Culture results may not be optimal due to an inadequate volume of blood received in culture bottles   Culture NO GROWTH 2 DAYS  Final   Report Status PENDING  Incomplete  Culture, respiratory (NON-Expectorated)     Status: None (Preliminary result)   Collection Time: 05/24/17  7:52 PM  Result Value Ref Range Status   Specimen Description SPUTUM  Final   Special Requests NONE  Final   Gram Stain   Final    RARE WBC PRESENT, PREDOMINANTLY PMN FEW SQUAMOUS EPITHELIAL CELLS PRESENT MODERATE GRAM POSITIVE COCCI FEW GRAM NEGATIVE RODS    Culture CULTURE REINCUBATED FOR BETTER GROWTH  Final   Report Status PENDING  Incomplete  Respiratory Panel by PCR     Status: None   Collection Time: 05/24/17  8:07 PM  Result Value Ref Range Status   Adenovirus NOT DETECTED NOT DETECTED Final   Coronavirus 229E NOT DETECTED NOT DETECTED Final   Coronavirus HKU1 NOT DETECTED NOT DETECTED Final   Coronavirus NL63 NOT DETECTED NOT DETECTED Final   Coronavirus OC43 NOT DETECTED NOT DETECTED Final   Metapneumovirus NOT DETECTED NOT DETECTED Final   Rhinovirus / Enterovirus NOT DETECTED NOT DETECTED Final   Influenza A NOT DETECTED NOT DETECTED Final   Influenza B NOT DETECTED NOT DETECTED Final   Parainfluenza Virus 1 NOT DETECTED NOT DETECTED Final   Parainfluenza Virus 2 NOT DETECTED NOT DETECTED Final   Parainfluenza  Virus 3 NOT DETECTED NOT DETECTED Final   Parainfluenza Virus 4 NOT DETECTED NOT DETECTED Final   Respiratory Syncytial Virus NOT DETECTED NOT DETECTED Final   Bordetella pertussis NOT DETECTED NOT DETECTED Final   Chlamydophila pneumoniae NOT DETECTED NOT DETECTED Final   Mycoplasma pneumoniae NOT DETECTED NOT DETECTED Final  MRSA PCR Screening     Status: None   Collection Time: 05/25/17 10:58 AM  Result Value Ref Range Status   MRSA by PCR NEGATIVE NEGATIVE Final    Comment:        The GeneXpert MRSA Assay (FDA approved for NASAL specimens only), is one component of a comprehensive MRSA colonization surveillance program. It is not intended to diagnose MRSA infection nor to guide or monitor treatment for MRSA infections.      Scheduled Meds: . apixaban  5 mg Oral BID  . digoxin  0.125 mg Oral Daily  . insulin aspart  0-5 Units Subcutaneous QHS  . insulin aspart  0-9 Units Subcutaneous TID WC  . metoprolol succinate  12.5 mg Oral BID  . nicotine  14 mg Transdermal Q24H   Continuous Infusions: . sodium chloride 75 mL/hr at 05/26/17 0349  . azithromycin Stopped (05/25/17 1843)  . cefTRIAXone (ROCEPHIN)  IV Stopped (05/25/17 1640)     LOS: 2 days   Cherene Altes, MD [facility] Office  [phone] Pager - Text Page per Amion as per below:  On-Call/Text Page:      [email or web address]  password [email or web address]  If 7PM-7AM, please contact night-coverage [email or web address] Password [facility] 05/26/2017, 3:32 PM

## 2017-05-27 ENCOUNTER — Inpatient Hospital Stay (HOSPITAL_COMMUNITY): Payer: Medicare Other

## 2017-05-27 DIAGNOSIS — I255 Ischemic cardiomyopathy: Secondary | ICD-10-CM

## 2017-05-27 DIAGNOSIS — J9691 Respiratory failure, unspecified with hypoxia: Secondary | ICD-10-CM

## 2017-05-27 DIAGNOSIS — J189 Pneumonia, unspecified organism: Secondary | ICD-10-CM

## 2017-05-27 DIAGNOSIS — Z7189 Other specified counseling: Secondary | ICD-10-CM

## 2017-05-27 LAB — CBC
HCT: 46.7 % (ref 39.0–52.0)
HEMOGLOBIN: 14.8 g/dL (ref 13.0–17.0)
MCH: 29.4 pg (ref 26.0–34.0)
MCHC: 31.7 g/dL (ref 30.0–36.0)
MCV: 92.7 fL (ref 78.0–100.0)
PLATELETS: 163 10*3/uL (ref 150–400)
RBC: 5.04 MIL/uL (ref 4.22–5.81)
RDW: 16 % — ABNORMAL HIGH (ref 11.5–15.5)
WBC: 13.6 10*3/uL — AB (ref 4.0–10.5)

## 2017-05-27 LAB — COMPREHENSIVE METABOLIC PANEL
ALT: 53 U/L (ref 17–63)
ANION GAP: 9 (ref 5–15)
AST: 38 U/L (ref 15–41)
Albumin: 2.4 g/dL — ABNORMAL LOW (ref 3.5–5.0)
Alkaline Phosphatase: 109 U/L (ref 38–126)
BUN: 80 mg/dL — ABNORMAL HIGH (ref 6–20)
CALCIUM: 7.8 mg/dL — AB (ref 8.9–10.3)
CHLORIDE: 101 mmol/L (ref 101–111)
CO2: 25 mmol/L (ref 22–32)
CREATININE: 1.88 mg/dL — AB (ref 0.61–1.24)
GFR, EST AFRICAN AMERICAN: 38 mL/min — AB (ref >60)
GFR, EST NON AFRICAN AMERICAN: 33 mL/min — AB (ref >60)
Glucose, Bld: 124 mg/dL — ABNORMAL HIGH (ref 65–99)
Potassium: 4.6 mmol/L (ref 3.5–5.1)
Sodium: 135 mmol/L (ref 135–145)
Total Bilirubin: 1.6 mg/dL — ABNORMAL HIGH (ref 0.3–1.2)
Total Protein: 4.3 g/dL — ABNORMAL LOW (ref 6.5–8.1)

## 2017-05-27 LAB — CULTURE, RESPIRATORY: CULTURE: NORMAL

## 2017-05-27 LAB — CULTURE, RESPIRATORY W GRAM STAIN

## 2017-05-27 LAB — DIGOXIN LEVEL: DIGOXIN LVL: 0.5 ng/mL — AB (ref 0.8–2.0)

## 2017-05-27 LAB — GLUCOSE, CAPILLARY: Glucose-Capillary: 113 mg/dL — ABNORMAL HIGH (ref 65–99)

## 2017-05-27 LAB — HIV ANTIBODY (ROUTINE TESTING W REFLEX): HIV Screen 4th Generation wRfx: NONREACTIVE

## 2017-05-27 MED ORDER — FUROSEMIDE 20 MG PO TABS: 20.0000 mg | ORAL_TABLET | Freq: Every day | ORAL | Status: AC

## 2017-05-27 MED ORDER — METOPROLOL SUCCINATE ER 25 MG PO TB24: 25.0000 mg | ORAL_TABLET | Freq: Two times a day (BID) | ORAL | 0 refills | Status: AC

## 2017-05-27 MED ORDER — ACETAMINOPHEN 325 MG PO TABS: 650.0000 mg | ORAL_TABLET | Freq: Four times a day (QID) | ORAL | Status: AC | PRN

## 2017-05-27 MED ORDER — AZITHROMYCIN 250 MG PO TABS: 250.0000 mg | ORAL_TABLET | Freq: Once | ORAL | 0 refills | Status: AC

## 2017-05-27 MED ORDER — CEFUROXIME AXETIL 500 MG PO TABS: 500.0000 mg | ORAL_TABLET | Freq: Two times a day (BID) | ORAL | 0 refills | Status: AC

## 2017-05-27 NOTE — Discharge Summary (Signed)
DISCHARGE SUMMARY  Tyler Cameron  MR#: [id number]  DOB:[birth year]  Date of Admission: 05/24/2017 Date of Discharge: 05/27/2017  Attending Physician:Chasin Findling T  [name] PXT:[id number], Ivin Booty, MD  Consults:  Palliative Care   Disposition: D/C home at insistence of patient   Follow-up Appts: Follow-up Information    Jonathon Jordan, MD Follow up in 5 day(s).   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  PCP noted intermittent beats of PVCs and PACs. EMS was called to take patient from PCPs office at which time patient was noted to be in atrial fibrillation.  Hospital Course:  Severe sepsis w/ lactic acidosis due to R lung Pneumonia   UA unrevealing - CXR noted infiltrate in RML - to complete a 7 day course of empiric abx tx - clinically much improved at time of d/c - sepsis resolved    Acute hypoxic resp failure  Rapidly improved w/ tx of PNA - sats 100% on RA at time of d/c   Acute kindey injury on CKD stage 3 Baseline crt 1.4 - required careful volume expansion during this hospital stay - avoid  ACEi/ARB for now - crt improving at time of d/c, and approaching baseline - BUN remains quite elevated, likely explaining his intermittent mild confusion, but is improving - pt will need recheck of his renal fxn within the next 5 days to help determine if continued use of his diuretic is appropriate or if dose needs to be adjusted - at time of d/c plan is to resume diuretic in 3 days   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 Caryl Comes - rate control improved following volume resuscitation and is stable at time of d/c   CAD - mildly Elevated troponin  S/p stent to LAD and circ 2001 - LHC 1/14 demonstrated patency of stent to LAD and the circumflex with occlusion of the right coronary artery with collaterals - no chest pain during this hospital stay   Ischemic chronic systolic CHF s/p AICD EX93% at baseline - f/u TTE this admit notes EF 15-20% w/ no WMA - followed daily weights and Is/Os - wgt in 2014 was 81.6kg - no severe overload at time of d/c - at time of admit pt was actually quite [facility] w/ attendant uremia - at time of d/c his diuretic is still being held - I would have preferred another 2-3 days to watch volume and renal fxn in the hospital but the pt is adamant that he is going  to leave today - future control of volume at home, without overload or DH is likely to be quite challenging w/ further decline in his EF   DM2 CBG well controlled at this time   HLD  HTN Was actually hypotensive th/o majority of hospital stay, due to [facility] - at time of d/c his BP has normalized   Allergies as of 05/27/2017      Reactions   Lopid [gemfibrozil]    "Ate away my muscles"   Pradaxa [dabigatran Etexilate Mesylate] Other (See Comments)   unknown   Xarelto [rivaroxaban]       Medication List    STOP taking these medications   lisinopril 2.5 MG tablet Commonly known as:   PRINIVIL,ZESTRIL     TAKE these medications   acetaminophen 325 MG tablet Commonly known as:  TYLENOL Take 2 tablets (650 mg total) every 6 (six) hours as needed by mouth for mild pain, fever or headache.   apixaban 5 MG Tabs tablet Commonly known as:  ELIQUIS Take 1 tablet (5 mg total) by mouth 2 (two) times daily.   azithromycin 250 MG tablet Commonly known as:  ZITHROMAX Take 1 tablet (250 mg total) once for 1 dose by mouth. Start taking on:  05/28/2017   cefUROXime 500 MG tablet Commonly known as:  CEFTIN Take 1 tablet (500 mg total) 2 (two) times daily with a meal by mouth.   digoxin 0.125 MG tablet Commonly known as:  LANOXIN Take 1 tablet (0.125 mg total) by mouth daily.   EX-LAX PO Take 1 tablet as needed by mouth (constipation).   furosemide 20 MG tablet Commonly known as:  LASIX Take 1 tablet (20 mg total) daily by mouth. Start taking on:  05/31/2017 What changed:  These instructions start on 05/31/2017. If you are unsure what to do until then, ask your doctor or other care provider.   insulin aspart 100 UNIT/ML injection Commonly known as:  novoLOG Inject 5-8 Units See admin instructions into the skin. 8 units in the morning, 5 units in the evening   metoprolol succinate 25 MG 24 hr tablet Commonly known as:  TOPROL-XL Take 1 tablet (25 mg total) 2 (two) times daily by mouth. Take with or immediately following a meal. Start taking on:  05/28/2017 What changed:    medication strength  how much to take   nitroGLYCERIN 0.4 MG SL tablet Commonly known as:  NITROSTAT Place 1 tablet (0.4 mg total) under the tongue every 5 (five) minutes x 3 doses as needed for chest pain.       Day of Discharge BP 117/80 (BP Location: Left Arm)   Pulse 86   Temp (!) 97.2 F (36.2 C) (Oral)   Resp (!) 21   Ht 5\' 10"  (1.778 m)   Wt 82 kg (180 lb 12.4 oz)   SpO2 100%   BMI 25.94 kg/m   Physical Exam: General: No acute respiratory distress - alert and oriented x4 -  mildly confused on details if pushed  Lungs: Clear to auscultation bilaterally without wheezes or crackles Cardiovascular: Regular rate without murmur gallop or rub normal S1 and S2 Abdomen: Nontender, nondistended, soft, bowel sounds positive, no rebound, no ascites, no appreciable mass Extremities: No significant cyanosis, clubbing, or edema B LE   Basic Metabolic Panel: Recent Labs  Lab 05/24/17 1326 05/24/17 1428 05/25/17 0550 05/26/17 0405 05/27/17 0823  NA 135 135 135 134* 135  K 5.3* 5.3* 5.2* 4.7 4.6  CL 98* 102 102  104 101  CO2 22  --  20* 20* 25  GLUCOSE 193* 179* 155* 130* 124*  BUN 110* 105* 105* 100* 80*  CREATININE 2.69* 2.40* 2.59* 2.27* 1.88*  CALCIUM 8.5*  --  8.0* 8.0* 7.8*  MG 2.7*  --   --   --   --     Liver Function Tests: Recent Labs  Lab 05/24/17 1326 05/25/17 0550 05/26/17 0405 05/27/17 0823  AST 57* 71* 52* 38  ALT 57 71* 64* 53  ALKPHOS 114 123 122 109  BILITOT 1.9* 1.7* 1.5* 1.6*  PROT 5.0* 4.9* 4.8* 4.3*  ALBUMIN 2.7* 2.7* 2.6* 2.4*    CBC: Recent Labs  Lab 05/24/17 1326 05/24/17 1428 05/25/17 0550 05/26/17 0405 05/27/17 0823  WBC 15.3*  --  18.9* 16.1* 13.6*  NEUTROABS 13.9*  --   --   --   --   HGB 15.7 15.6 16.2 15.8 14.8  HCT 48.9 46.0 49.7 49.1 46.7  MCV 93.7  --  93.2 92.3 92.7  PLT 200  --  198 187 163    Cardiac Enzymes: Recent Labs  Lab 05/24/17 1955 05/25/17 0222 05/25/17 0550  TROPONINI 0.11* 0.10* 0.13*    CBG: Recent Labs  Lab 05/26/17 0834 05/26/17 1119 05/26/17 1653 05/26/17 2012 05/27/17 0748  GLUCAP 121* 148* 178* 173* 113*    Recent Results (from the past 240 hour(s))  Blood Culture (routine x 2)     Status: None (Preliminary result)   Collection Time: 05/24/17  3:28 PM  Result Value Ref Range Status   Specimen Description BLOOD RIGHT WRIST  Final   Special Requests   Final    BOTTLES DRAWN AEROBIC AND ANAEROBIC Blood Culture adequate volume   Culture NO GROWTH 2 DAYS  Final   Report  Status PENDING  Incomplete  Blood Culture (routine x 2)     Status: None (Preliminary result)   Collection Time: 05/24/17  4:11 PM  Result Value Ref Range Status   Specimen Description BLOOD LEFT HAND  Final   Special Requests   Final    BOTTLES DRAWN AEROBIC ONLY Blood Culture results may not be optimal due to an inadequate volume of blood received in culture bottles   Culture NO GROWTH 2 DAYS  Final   Report Status PENDING  Incomplete  Culture, respiratory (NON-Expectorated)     Status: None   Collection Time: 05/24/17  7:52 PM  Result Value Ref Range Status   Specimen Description SPUTUM  Final   Special Requests NONE  Final   Gram Stain   Final    RARE WBC PRESENT, PREDOMINANTLY PMN FEW SQUAMOUS EPITHELIAL CELLS PRESENT MODERATE GRAM POSITIVE COCCI FEW GRAM NEGATIVE RODS    Culture ABUNDANT Consistent with normal respiratory flora.  Final   Report Status 05/27/2017 FINAL  Final  Respiratory Panel by PCR     Status: None   Collection Time: 05/24/17  8:07 PM  Result Value Ref Range Status   Adenovirus NOT DETECTED NOT DETECTED Final   Coronavirus 229E NOT DETECTED NOT DETECTED Final   Coronavirus HKU1 NOT DETECTED NOT DETECTED Final   Coronavirus NL63 NOT DETECTED NOT DETECTED Final   Coronavirus OC43 NOT DETECTED NOT DETECTED Final   Metapneumovirus NOT DETECTED NOT DETECTED Final   Rhinovirus / Enterovirus NOT DETECTED NOT DETECTED Final   Influenza A NOT DETECTED NOT DETECTED Final   Influenza B NOT DETECTED NOT DETECTED Final   Parainfluenza Virus 1 NOT DETECTED NOT DETECTED Final  Parainfluenza Virus 2 NOT DETECTED NOT DETECTED Final   Parainfluenza Virus 3 NOT DETECTED NOT DETECTED Final   Parainfluenza Virus 4 NOT DETECTED NOT DETECTED Final   Respiratory Syncytial Virus NOT DETECTED NOT DETECTED Final   Bordetella pertussis NOT DETECTED NOT DETECTED Final   Chlamydophila pneumoniae NOT DETECTED NOT DETECTED Final   Mycoplasma pneumoniae NOT DETECTED NOT DETECTED  Final  MRSA PCR Screening     Status: None   Collection Time: 05/25/17 10:58 AM  Result Value Ref Range Status   MRSA by PCR NEGATIVE NEGATIVE Final    Comment:        The GeneXpert MRSA Assay (FDA approved for NASAL specimens only), is one component of a comprehensive MRSA colonization surveillance program. It is not intended to diagnose MRSA infection nor to guide or monitor treatment for MRSA infections.       Time spent in discharge (includes decision making & examination of pt): 35 minutes  05/27/2017, 12:26 PM   Cherene Altes, MD [facility] Office  [phone] Pager [phone]  On-Call/Text Page:      [email or web address]      password [facility]

## 2017-05-27 NOTE — Care Management Note (Signed)
Case Management Note  Patient Details  Name: Tyler Cameron MRN: [id number] Date of Birth: 11/15/1940  Subjective/Objective:                 Spoke with daughters Charlett Nose and Chrys Racer in hallway. They would like to dc with home hospice. They want a company that can start here and follow to [facility] Sunday to Sunday while patient goes to daughter's house for Thanksgiving. Discussed agencies. [facility] w [facility] who will come within the hour to speak with family.  At home patient has RW WC cane. Patient on RA, but nurse reports DOE. Patient voicing that he wants to DC ASAP.    Action/Plan:   Expected Discharge Date:                  Expected Discharge Plan:  Home w Hospice Care  In-House Referral:     Discharge planning Services  CM Consult  Post Acute Care Choice:    Choice offered to:  Adult Children  DME Arranged:    DME Agency:     HH Arranged:    HH Agency:  [place]  Status of Service:  In process, will continue to follow  If discussed at Long Length of Stay Meetings, dates discussed:    Additional Comments:  Carles Collet, RN 05/27/2017, 12:10 PM

## 2017-05-27 NOTE — Progress Notes (Signed)
   05/27/17 1000  What Happened  Was fall witnessed? Yes  Who witnessed fall? Danae Chen, NT, student nurse, and instructor  Patients activity before fall bathroom-assisted  Point of contact buttocks (lowered by staff)  Was patient injured? No  Follow Up  MD notified Thereasa Solo  Time MD notified 1000  Family notified Yes-comment (daughters at bedside)  Time family notified 1000  Additional tests No  Progress note created (see row info) Yes  Adult Fall Risk Assessment  Risk Factor Category (scoring not indicated) Fall has occurred during this admission (document High fall risk)  [name] Fall Risk High Fall Risk (>13 points)  Adult Fall Risk Interventions  Required Bundle Interventions *See Row Information* High fall risk - low, moderate, and high requirements implemented  Additional Interventions Use of appropriate toileting equipment (bedpan, BSC, etc.);PT/OT need assessed if change in mobility from baseline  Screening for Fall Injury Risk  Risk For Fall Injury- See Row Information  None identified  Vitals  BP 117/80  BP Location Left Arm  BP Method Automatic  Patient Position (if appropriate) Sitting  ECG Heart Rate 98  Resp (!) 21  Pain Assessment  Pain Assessment No/denies pain  Patients Stated Pain Goal 0  PCA/Epidural/Spinal Assessment  Respiratory Pattern Regular;Unlabored  Neurological  Neuro (WDL) WDL  Level of Consciousness Alert  Orientation Level Oriented X4  Cognition Appropriate at baseline  Speech Clear  Pupil Assessment  No  Neuro Symptoms None  Neuro Additional Assessments No  Glasgow Coma Scale  Eye Opening 4  Best Motor Response 6  Best Verbal Response (NON-intubated) 5  Glasgow Coma Scale Score 15  Musculoskeletal  Musculoskeletal (WDL) X  Assistive Device Front wheel walker  Generalized Weakness Yes  Weight Bearing Restrictions No  Integumentary  Integumentary (WDL) X ( no new skin issues post fall)  Skin Color Appropriate for ethnicity  Skin  Condition Dry  Skin Integrity Other (Comment);Blister;MASD  Description of Blister Blood  Blister Location Back  Blister Location Orientation Left;Upper  Blister Intervention Foam  Moisture Associated Skin Damage Location Buttocks  Moisture Associated Skin Damage Orientation Bilateral  Moisture Associated Skin Damage Intervention Barrier cream  Skin Turgor Non-tenting

## 2017-05-27 NOTE — Progress Notes (Signed)
Daily Progress Note   Patient Name: Tyler Cameron       Date: 05/27/2017 DOB: 04-Jul-1941  Age: 76 y.o. MRN#: [id number] Attending Physician: Tyler Altes, MD Primary Care Physician: Tyler Jordan, MD Admit Date: 05/24/2017  Reason for Consultation/Follow-up: Establishing goals of care  Subjective: Met with patient's daughters, Tyler Cameron and Tyler Cameron. Patient is very adamant that he wants to discharge home today. Patient very weak and experienced near fall incident just prior to this meeting. Tyler Cameron and Tyler Cameron remain concerned for patient's discharge.  [name] verbalizes primary goal as leaving hospital. He does not view being in hospital as quality of life and he believes that staying in the hospital will weaken him further. He has additional goal of going to [facility] on Sunday with his family for Thanksgiving.  Noted during conversation patient had period of change in mental status, where he seemed to drift off, but quickly returned when verbally stimulated.  Patient is willing to discharge home to his daughter Tyler Cameron's home.  Discussed patient's status with Tyler Cameron- appears patient's decline started in early October. He has not driven since the beginning of October. He has had difficulty walking. He had stopped going to the gym. His functional status prior to admission is not as well as he represented it to be. Given patient's desire to be at home, but his willingness to work with therapy, I am unsure of his eligibility for Hospice. Recommend Palliative medicine outpatient to continue to follow, as well as home PT and OT.  Review of Systems  Constitutional: Positive for malaise/fatigue. Negative for weight loss.  Cardiovascular: Positive for palpitations.  Neurological: Positive for weakness.      Length of Stay: 3  Current Medications: Scheduled Meds:  . apixaban  5 mg Oral BID  . azithromycin  250 mg Oral Daily  . cefUROXime  500 mg Oral BID WC  . digoxin  0.125 mg Oral Daily  . insulin aspart  0-5 Units Subcutaneous QHS  . insulin aspart  0-9 Units Subcutaneous TID WC  . nicotine  14 mg Transdermal Q24H    Continuous Infusions:   PRN Meds: acetaminophen, levalbuterol, LORazepam, ondansetron **OR** ondansetron (ZOFRAN) IV, senna  Physical Exam  Constitutional:  cachetic  HENT:  Poor dentition  Neurological:  Alert with periods of lethargy during conversation  Nursing note  and vitals reviewed.           Vital Signs: BP 117/80 (BP Location: Left Arm)   Pulse 86   Temp (!) 97.2 F (36.2 C) (Oral)   Resp (!) 21   Ht _0  (1.778 m)   Wt 82 kg (180 lb 12.4 oz)   SpO2 100%   BMI 25.94 kg/m  SpO2: SpO2: 100 % O2 Device: O2 Device: Not Delivered O2 Flow Rate: O2 Flow Rate (L/min): 2 L/min  Intake/output summary:   Intake/Output Summary (Last 24 hours) at 05/27/2017 1233 Last data filed at 05/27/2017 0700 Gross per 24 hour  Intake 120 ml  Output 400 ml  Net -280 ml   LBM: Last BM Date: 05/25/17 Baseline Weight: Weight: 78 kg (172 lb) Most recent weight: Weight: 82 kg (180 lb 12.4 oz)       Palliative Assessment/Data: PPS: 50%      Patient Active Problem List   Diagnosis Date Noted  . Pressure injury of skin 05/26/2017  . AKI (acute kidney injury) ([place]) 05/24/2017  . Acute on chronic systolic heart failure ([place]) 01/18/2013  . AICD (automatic cardioverter/defibrillator) present   . Diabetes mellitus with complication ([place])   . Ventricular tachycardia ([place]) 07/30/2012  . CAD (coronary artery disease) 07/30/2012  . Ischemic cardiomyopathy 07/30/2012  . Atrial fibrillation ([place])   . Left atrial thrombus   . Hyperlipidemia   . Hypertensive heart disease without CHF     Palliative Care Assessment & Plan   Patient Profile: 76 y.o. male   with past medical history of CHF, a. Fib, AICD, CAD, DM, atrial thrombus  admitted on 05/24/2017 with sepsis r/t pneumonia. During admission has had some ectopy r/t cardiac stress from illness. Not requiring oxygen. ECHO shows EF 15-20%. He was admitted from home due to being very weak. Per his daughter's report he was unable to walk to the car. This morning he became angry and upset, stating he wanted to be discharged. Palliative medicine consulted for [place].    Assessment/Recommendations/Plan    D/C to daughter Tyler Cameron's home with [facility], PT/OT  Outpatient Palliative referral- will call primary MD and request- patient may quickly transition to Hospice given his desire to remain at home and not return to hospital. He also has significant deconditioning and his heart is showing significant indications of damage with non sustained runs of VT during our consult, as well as hypotension. I am concerned about his prognosis and ability to recover. I discussed this with Tyler Cameron and she understands that Hospice may be appropriate as well.   Goals of Care and Additional Recommendations:  Limitations on Scope of Treatment: Avoid Hospitalization  Code Status:  DNR  Prognosis:   < 12 months may be less depending on patient's recovery from this illness  Discharge Planning:  Home with Palliative Services  Care plan was discussed with patient's daughter, Tyler Cameron and Tyler Cameron.  Thank you for allowing the Palliative Medicine Team to assist in the care of this patient.   Time In: 1000 Time Out: 1100 Total Time 60 mins Prolonged Time Billed Yes      Greater than 50%  of this time was spent counseling and coordinating care related to the above assessment and plan.  Tyler Cameron, AGNP-C Palliative Medicine   Please contact Palliative Medicine Team phone at [phone] for questions and concerns.

## 2017-05-27 NOTE — Evaluation (Signed)
Physical Therapy Evaluation Patient Details Name: Tyler Cameron MRN: [id number] DOB: [birth year] Today's Date: 05/27/2017   History of Present Illness  76 y.o. male with a history of Afib, AICD, CAD, DM, HLD, and Atrial thrombus who presented from his primary care physician's office due to progressive generalized weakness, chest heaviness, worsening somnolence, and sporadic episodes of dysuria and urinary urgency.  PCP noted intermittent beats of PVCs and PACs. EMS was called to take patient from PCPs office at which time patient was noted to be in atrial fibrillation.    Clinical Impression  Patient presents with problems listed below.  Will benefit from acute PT to maximize functional mobility prior to discharge.  Patient with decreased strength, balance, and cognition, impacting mobility/gait/safety.  Would recommend an inpatient setting for continued therapy, however understand per notes that patient wants to return home now.  Therefore recommend HHPT at d/c for continued therapy (if offered with Hospice at home).  Instructed patient to use his RW at all times for safety during gait.    Follow Up Recommendations Home health PT;Supervision for mobility/OOB    Equipment Recommendations  None recommended by PT    Recommendations for Other Services       Precautions / Restrictions Precautions Precautions: Fall Restrictions Weight Bearing Restrictions: No      Mobility  Bed Mobility Overal bed mobility: Modified Independent             General bed mobility comments: Increased time for supine <> sit.  Transfers Overall transfer level: Needs assistance Equipment used: Rolling walker (2 wheeled) Transfers: Sit to/from Stand Sit to Stand: Min guard         General transfer comment: Min guard from EOB and toilet for safety/balance.  Increased time required.  Ambulation/Gait Ambulation/Gait assistance: Min guard;Min assist Ambulation Distance (Feet): 68 Feet Assistive  device: Rolling walker (2 wheeled) Gait Pattern/deviations: Step-through pattern;Decreased stride length;Shuffle;Trunk flexed Gait velocity: decreased Gait velocity interpretation: Below normal speed for age/gender General Gait Details: Verbal cues for safe use of RW, and to stand upright for improved posture during gait.  Patient reports DOE during gait of 3/4.  Did require 2 standing rest breaks.  Patient let go of RW near sink and lost balance posteriorly, requiring assist to prevent fall.  Cues to keep hands on RW at all times.  Stairs            Wheelchair Mobility    Modified Rankin (Stroke Patients Only)       Balance Overall balance assessment: Needs assistance Sitting-balance support: Feet supported;No upper extremity supported;Bilateral upper extremity supported Sitting balance-Leahy Scale: Good     Standing balance support: Single extremity supported;During functional activity;Bilateral upper extremity supported Standing balance-Leahy Scale: Poor Standing balance comment: Requires UE support for gait/dynamic activities in standing.                             Pertinent Vitals/Pain Pain Assessment: No/denies pain    Home Living Family/patient expects to be discharged to:: Private residence Living Arrangements: Alone Available Help at Discharge: Family;Available PRN/intermittently(Daughter) Type of Home: Apartment Home Access: Stairs to enter Entrance Stairs-Rails: Right;Left Entrance Stairs-Number of Steps: full of light Home Layout: One level Home Equipment: Cane - single point;Walker - 2 wheels;Wheelchair - manual      Prior Function Level of Independence: Independent with assistive device(s)         Comments: Uses cane for gait, or holds on to furniture.  Hand Dominance        Extremity/Trunk Assessment   Upper Extremity Assessment Upper Extremity Assessment: Defer to OT evaluation    Lower Extremity Assessment Lower  Extremity Assessment: Generalized weakness       Communication   Communication: No difficulties  Cognition Arousal/Alertness: Awake/alert Behavior During Therapy: Flat affect;Impulsive Overall Cognitive Status: No family/caregiver present to determine baseline cognitive functioning                                 General Comments: deccreased safety awareness      General Comments      Exercises     Assessment/Plan    PT Assessment Patient needs continued PT services  PT Problem List Decreased strength;Decreased activity tolerance;Decreased balance;Decreased mobility;Decreased cognition;Decreased knowledge of use of DME;Decreased safety awareness;Cardiopulmonary status limiting activity       PT Treatment Interventions DME instruction;Gait training;Stair training;Functional mobility training;Therapeutic activities;Therapeutic exercise;Balance training;Cognitive remediation;Patient/family education    PT Goals (Current goals can be found in the Care Plan section)  Acute Rehab PT Goals Patient Stated Goal: To go home today PT Goal Formulation: With patient Time For Goal Achievement: 06/03/17 Potential to Achieve Goals: Good    Frequency Min 3X/week   Barriers to discharge Inaccessible home environment;Decreased caregiver support Flight of stairs to enter apartment.  Patient lives alone.    Co-evaluation               AM-PAC PT "6 Clicks" Daily Activity  Outcome Measure Difficulty turning over in bed (including adjusting bedclothes, sheets and blankets)?: None Difficulty moving from lying on back to sitting on the side of the bed? : None Difficulty sitting down on and standing up from a chair with arms (e.g., wheelchair, bedside commode, etc,.)?: A Little Help needed moving to and from a bed to chair (including a wheelchair)?: A Little Help needed walking in hospital room?: A Little Help needed climbing 3-5 steps with a railing? : A Lot 6 Click  Score: 19    End of Session Equipment Utilized During Treatment: Gait belt Activity Tolerance: Patient limited by fatigue Patient left: in bed;with call bell/phone within reach;with bed alarm set Nurse Communication: Mobility status(Recommend HHPT) PT Visit Diagnosis: Unsteadiness on feet (R26.81);Other abnormalities of gait and mobility (R26.89);History of falling (Z91.81);Muscle weakness (generalized) (M62.81)    Time: 5625-6389 PT Time Calculation (min) (ACUTE ONLY): 29 min   Charges:   PT Evaluation $PT Eval Moderate Complexity: 1 Mod PT Treatments $Gait Training: 8-22 mins   PT G Codes:        Carita Pian. Sanjuana Kava, [facility] Acute Rehab Services Pager [place] 05/27/2017, 2:58 PM

## 2017-05-27 NOTE — Care Management (Signed)
1422 05-27-17  [facility] Liaison to get Hospice Orders from the patient's PCP. No DME needs at this time. Tentatively to admit to [facility] on Sat. No further needs from CM at this time. Bethena Roys, RN,BSN Case Manager [phone]

## 2017-05-27 NOTE — Evaluation (Signed)
Occupational Therapy Evaluation Patient Details Name: Tyler Cameron MRN: [id number] DOB: 1941/03/23 Today's Date: 05/27/2017    History of Present Illness 76 y.o. male with a history of Afib, AICD, CAD, DM, HLD, and Atrial thrombus who presented from his primary care physician's office due to progressive generalized weakness, chest heaviness, worsening somnolence, and sporadic episodes of dysuria and urinary urgency.  PCP noted intermittent beats of PVCs and PACs. EMS was called to take patient from PCPs office at which time patient was noted to be in atrial fibrillation.   Clinical Impression   Pt admitted with the above diagnoses and presents with below problem list. Pt will benefit from continued acute OT to address the below listed deficits and maximize independence with basic ADLs prior to d/c below. PTA pt reports he was independent with ADLs, drives, does his own grocery shopping. Pt presents with generalized weakness/fatigue, impaired balance, and impaired safety awareness (unsure baseline cognition). Pt currently min guard to min A with ADLs. Unsteadiness during functional tasks in standing position, seeks external support often. Pt would greatly benefit from continued therapy in the home to increase safety and functional independence with ADLs.      Follow Up Recommendations  Home health OT    Equipment Recommendations  3 in 1 bedside commode    Recommendations for Other Services PT consult     Precautions / Restrictions Precautions Precautions: Fall Restrictions Weight Bearing Restrictions: No      Mobility Bed Mobility               General bed mobility comments: not observed  Transfers Overall transfer level: Needs assistance   Transfers: Sit to/from Stand Sit to Stand: Min guard         General transfer comment: close min guard from EOB, struggled from regular toilet height (relied on momentum to power up to stand).     Balance Overall balance  assessment: Needs assistance Sitting-balance support: Feet supported;No upper extremity supported;Bilateral upper extremity supported Sitting balance-Leahy Scale: Fair     Standing balance support: Single extremity supported;During functional activity;Bilateral upper extremity supported Standing balance-Leahy Scale: Poor Standing balance comment: consistently seeking external support for dynamic standing tasks.                            ADL either performed or assessed with clinical judgement   ADL Overall ADL's : Needs assistance/impaired Eating/Feeding: Set up;Sitting   Grooming: Min guard;Set up;Sitting;Standing   Upper Body Bathing: Set up;Sitting   Lower Body Bathing: Min guard;Minimal assistance;Sit to/from stand   Upper Body Dressing : Set up;Sitting   Lower Body Dressing: Min guard;Sit to/from stand   Toilet Transfer: Min guard;Ambulation Toilet Transfer Details (indicate cue type and reason): struggled to stand from regular height toilet Toileting- Clothing Manipulation and Hygiene: Min guard;Minimal assistance;Sit to/from stand Toileting - Clothing Manipulation Details (indicate cue type and reason): assist for throughness of posterior pericare.  Tub/ Shower Transfer: Tub transfer;Min guard;Ambulation Tub/Shower Transfer Details (indicate cue type and reason): discussed using wall to stabilize instead of shower curtain. discussed 3n1 as shower seat. Functional mobility during ADLs: Min guard General ADL Comments: Close min guard for OOB mobility/transfers and LB ADLs. Pt consistently seeking external support for balance. Unsteadiness in dynamic standing but no overt LOB.      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.)       Perception     Praxis  Pertinent Vitals/Pain Pain Assessment: No/denies pain     Hand Dominance     Extremity/Trunk Assessment Upper Extremity Assessment Upper Extremity Assessment:  Generalized weakness   Lower Extremity Assessment Lower Extremity Assessment: Defer to PT evaluation       Communication Communication Communication: No difficulties   Cognition Arousal/Alertness: Awake/alert Behavior During Therapy: Flat affect;Impulsive Overall Cognitive Status: No family/caregiver present to determine baseline cognitive functioning                                 General Comments: deccreased safety awareness   General Comments  Pt received sitting EOB. Observed soiled underwear and bed pad when pt stood from EOB. Pt with decreased awareness (vs concern?) that he had had an episode bowel incontinence.    Exercises     Shoulder Instructions      Home Living Family/patient expects to be discharged to:: Private residence Living Arrangements: Alone Available Help at Discharge: Family;Available PRN/intermittently(daughter) Type of Home: Apartment Home Access: Stairs to enter Entrance Stairs-Number of Steps: full of light Entrance Stairs-Rails: Right;Left Home Layout: One level     Bathroom Shower/Tub: Tub/shower unit         Home Equipment: Cane - single point;Walker - 2 wheels          Prior Functioning/Environment Level of Independence: Independent        Comments: occasional use of cane        OT Problem List: Decreased strength;Decreased activity tolerance;Impaired balance (sitting and/or standing);Decreased cognition;Decreased safety awareness;Decreased knowledge of use of DME or AE;Decreased knowledge of precautions;Cardiopulmonary status limiting activity      OT Treatment/Interventions: Self-care/ADL training;Energy conservation;DME and/or AE instruction;Therapeutic activities;Patient/family education;Balance training    OT Goals(Current goals can be found in the care plan section) Acute Rehab OT Goals Patient Stated Goal: home OT Goal Formulation: With patient Time For Goal Achievement: 06/03/17 Potential to Achieve  Goals: Good ADL Goals Pt Will Perform Grooming: with supervision;standing Pt Will Perform Upper Body Bathing: with set-up;sitting Pt Will Perform Lower Body Bathing: with supervision;sit to/from stand Pt Will Transfer to Toilet: with modified independence;ambulating Pt Will Perform Toileting - Clothing Manipulation and hygiene: with modified independence;sit to/from stand Pt Will Perform Tub/Shower Transfer: Tub transfer;with supervision;ambulating;3 in 1  OT Frequency: Min 2X/week   Barriers to D/C:            Co-evaluation              AM-PAC PT "6 Clicks" Daily Activity     Outcome Measure Help from another person eating meals?: None Help from another person taking care of personal grooming?: A Little Help from another person toileting, which includes using toliet, bedpan, or urinal?: A Little Help from another person bathing (including washing, rinsing, drying)?: A Little Help from another person to put on and taking off regular upper body clothing?: A Little Help from another person to put on and taking off regular lower body clothing?: A Little 6 Click Score: 19   End of Session    Activity Tolerance: Patient limited by fatigue;Patient tolerated treatment well Patient left: with nursing/sitter in room;Other (comment)(at sink washing up with NT assistance)  OT Visit Diagnosis: Unsteadiness on feet (R26.81);Muscle weakness (generalized) (M62.81);Other symptoms and signs involving cognitive function                Time: 1829-9371 OT Time Calculation (min): 32 min Charges:  OT General Charges $OT Visit:  1 Visit OT Evaluation $OT Eval Low Complexity: 1 Low OT Treatments $Self Care/Home Management : 8-22 mins G-Codes:       Hortencia Pilar 05/27/2017, 9:59 AM

## 2017-05-27 NOTE — Discharge Instructions (Signed)
You need to have a BMET (basic metabolic panel) checked in 5 days, to evaluate your kidney function and potassium level.  This can be done by your Primary Care Physician, or by virtually any medical clinic if you are out of town.  Here is your current baseline BMET for your physician to use as a reference as needed:  BMET    Component Value Date/Time   NA 135 05/27/2017 0823   K 4.6 05/27/2017 0823   CL 101 05/27/2017 0823   CO2 25 05/27/2017 0823   GLUCOSE 124 (H) 05/27/2017 0823   BUN 80 (H) 05/27/2017 0823   CREATININE 1.88 (H) 05/27/2017 0823   CALCIUM 7.8 (L) 05/27/2017 0823   GFRNONAA 33 (L) 05/27/2017 0823   GFRAA 38 (L) 05/27/2017 7106     Community-Acquired Pneumonia, Adult Pneumonia is an infection of the lungs. One type of pneumonia can happen while a person is in a hospital. A different type can happen when a person is not in a hospital (community-acquired pneumonia). It is easy for this kind to spread from person to person. It can spread to you if you breathe near an infected person who coughs or sneezes. Some symptoms include:  A dry cough.  A wet (productive) cough.  Fever.  Sweating.  Chest pain.  Follow these instructions at home:  Take over-the-counter and prescription medicines only as told by your doctor. ? Only take cough medicine if you are losing sleep. ? If you were prescribed an antibiotic medicine, take it as told by your doctor. Do not stop taking the antibiotic even if you start to feel better.  Sleep with your head and neck raised (elevated). You can do this by putting a few pillows under your head, or you can sleep in a recliner.  Do not use tobacco products. These include cigarettes, chewing tobacco, and e-cigarettes. If you need help quitting, ask your doctor.  Drink enough water to keep your pee (urine) clear or pale yellow. A shot (vaccine) can help prevent pneumonia. Shots are often suggested for:  People older than 78 years of  age.  People older than 78 years of age: ? Who are having cancer treatment. ? Who have long-term (chronic) lung disease. ? Who have problems with their body's defense system (immune system).  You may also prevent pneumonia if you take these actions:  Get the flu (influenza) shot every year.  Go to the dentist as often as told.  Wash your hands often. If soap and water are not available, use hand sanitizer.  Contact a doctor if:  You have a fever.  You lose sleep because your cough medicine does not help. Get help right away if:  You are short of breath and it gets worse.  You have more chest pain.  Your sickness gets worse. This is very serious if: ? You are an older adult. ? Your body's defense system is weak.  You cough up blood. This information is not intended to replace advice given to you by your health care provider. Make sure you discuss any questions you have with your health care provider. Document Released: 12/16/2007 Document Revised: 12/05/2015 Document Reviewed: 10/24/2014 Elsevier Interactive Patient Education  2018 [employer].   Heart Failure Heart failure is a condition in which the heart has trouble pumping blood because it has become weak or stiff. This means that the heart does not pump blood efficiently for the body to work well. For some people with heart failure, fluid  may back up into the lungs and there may be swelling (edema) in the lower legs. Heart failure is usually a long-term (chronic) condition. It is important for you to take good care of yourself and follow the treatment plan from your health care provider. What are the causes? This condition is caused by some health problems, including:  High blood pressure (hypertension). Hypertension causes the heart muscle to work harder than normal. High blood pressure eventually causes the heart to become stiff and weak.  Coronary artery disease (CAD). CAD is the buildup of cholesterol and fat  (plaques) in the arteries of the heart.  Heart attack (myocardial infarction). Injured tissue, which is caused by the heart attack, does not contract as well and the heart's ability to pump blood is weakened.  Abnormal heart valves. When the heart valves do not open and close properly, the heart muscle must pump harder to keep the blood flowing.  Heart muscle disease (cardiomyopathy or myocarditis). Heart muscle disease is damage to the heart muscle from a variety of causes, such as drug or alcohol abuse, infections, or unknown causes. These can increase the risk of heart failure.  Lung disease. When the lungs do not work properly, the heart must work harder.  What increases the risk? Risk of heart failure increases as a person ages. This condition is also more likely to develop in people who:  Are overweight.  Are male.  Smoke or chew tobacco.  Abuse alcohol or illegal drugs.  Have taken medicines that can damage the heart, such as chemotherapy drugs.  Have diabetes. ? High blood sugar (glucose) is associated with high fat (lipid) levels in the blood. ? Diabetes can also damage tiny blood vessels that carry nutrients to the heart muscle.  Have abnormal heart rhythms.  Have thyroid problems.  Have low blood counts (anemia).  What are the signs or symptoms? Symptoms of this condition include:  Shortness of breath with activity, such as when climbing stairs.  Persistent cough.  Swelling of the feet, ankles, legs, or abdomen.  Unexplained weight gain.  Difficulty breathing when lying flat (orthopnea).  Waking from sleep because of the need to sit up and get more air.  Rapid heartbeat.  Fatigue and loss of energy.  Feeling light-headed, dizzy, or close to fainting.  Loss of appetite.  Nausea.  Increased urination during the night (nocturia).  Confusion.  How is this diagnosed? This condition is diagnosed based on:  Medical history, symptoms, and a physical  exam.  Diagnostic tests, which may include: ? Echocardiogram. ? Electrocardiogram (ECG). ? Chest X-ray. ? Blood tests. ? Exercise stress test. ? Radionuclide scans. ? Cardiac catheterization and angiogram.  How is this treated? Treatment for this condition is aimed at managing the symptoms of heart failure. Medicines, behavioral changes, or other treatments may be necessary to treat heart failure. Medicines These may include:  Angiotensin-converting enzyme (ACE) inhibitors. This type of medicine blocks the effects of a blood protein called angiotensin-converting enzyme. ACE inhibitors relax (dilate) the blood vessels and help to lower blood pressure.  Angiotensin receptor blockers (ARBs). This type of medicine blocks the actions of a blood protein called angiotensin. ARBs dilate the blood vessels and help to lower blood pressure.  Water pills (diuretics). Diuretics cause the kidneys to remove salt and water from the blood. The extra fluid is removed through urination, leaving a lower volume of blood that the heart has to pump.  Beta blockers. These improve heart muscle strength and they prevent  the heart from beating too quickly.  Digoxin. This increases the force of the heartbeat.  Healthy behavior changes These may include:  Reaching and maintaining a healthy weight.  Stopping smoking or chewing tobacco.  Eating heart-healthy foods.  Limiting or avoiding alcohol.  Stopping use of street drugs (illegal drugs).  Physical activity.  Other treatments These may include:  Surgery to open blocked coronary arteries or repair damaged heart valves.  Placement of a biventricular pacemaker to improve heart muscle function (cardiac resynchronization therapy). This device paces both the right ventricle and left ventricle.  Placement of a device to treat serious abnormal heart rhythms (implantable cardioverter defibrillator, or ICD).  Placement of a device to improve the pumping  ability of the heart (left ventricular assist device, or LVAD).  Heart transplant. This can cure heart failure, and it is considered for certain patients who do not improve with other therapies.  Follow these instructions at home: Medicines  Take over-the-counter and prescription medicines only as told by your health care provider. Medicines are important in reducing the workload of your heart, slowing the progression of heart failure, and improving your symptoms. ? Do not stop taking your medicine unless your health care provider told you to do that. ? Do not skip any dose of medicine. ? Refill your prescriptions before you run out of medicine. You need your medicines every day. Eating and drinking   Eat heart-healthy foods. Talk with a dietitian to make an eating plan that is right for you. ? Choose foods that contain no trans fat and are low in saturated fat and cholesterol. Healthy choices include fresh or frozen fruits and vegetables, fish, lean meats, legumes, fat-free or low-fat dairy products, and whole-grain or high-fiber foods. ? Limit salt (sodium) if directed by your health care provider. Sodium restriction may reduce symptoms of heart failure. Ask a dietitian to recommend heart-healthy seasonings. ? Use healthy cooking methods instead of frying. Healthy methods include roasting, grilling, broiling, baking, poaching, steaming, and stir-frying.  Limit your fluid intake if directed by your health care provider. Fluid restriction may reduce symptoms of heart failure. Lifestyle  Stop smoking or using chewing tobacco. Nicotine and tobacco can damage your heart and your blood vessels. Do not use nicotine gum or patches before talking to your health care provider.  Limit alcohol intake to no more than 1 drink per day for non-pregnant women and 2 drinks per day for men. One drink equals 12 oz of beer, 5 oz of wine, or 1 oz of hard liquor. ? Drinking more than that is harmful to your  heart. Tell your health care provider if you drink alcohol several times a week. ? Talk with your health care provider about whether any level of alcohol use is safe for you. ? If your heart has already been damaged by alcohol or you have severe heart failure, drinking alcohol should be stopped completely.  Stop use of illegal drugs.  Lose weight if directed by your health care provider. Weight loss may reduce symptoms of heart failure.  Do moderate physical activity if directed by your health care provider. People who are elderly and people with severe heart failure should consult with a health care provider for physical activity recommendations. Monitor important information  Weigh yourself every day. Keeping track of your weight daily helps you to notice excess fluid sooner. ? Weigh yourself every morning after you urinate and before you eat breakfast. ? Wear the same amount of clothing each time you  weigh yourself. ? Record your daily weight. Provide your health care provider with your weight record.  Monitor and record your blood pressure as told by your health care provider.  Check your pulse as told by your health care provider. Dealing with extreme temperatures  If the weather is extremely hot: ? Avoid vigorous physical activity. ? Use air conditioning or fans or seek a cooler location. ? Avoid caffeine and alcohol. ? Wear loose-fitting, lightweight, and light-colored clothing.  If the weather is extremely cold: ? Avoid vigorous physical activity. ? Layer your clothes. ? Wear mittens or gloves, a hat, and a scarf when you go outside. ? Avoid alcohol. General instructions  Manage other health conditions such as hypertension, diabetes, thyroid disease, or abnormal heart rhythms as told by your health care provider.  Learn to manage stress. If you need help to do this, ask your health care provider.  Plan rest periods when fatigued.  Get ongoing education and support as  needed.  Participate in or seek rehabilitation as needed to maintain or improve independence and quality of life.  Stay up to date with immunizations. Keeping current on pneumococcal and influenza immunizations is especially important to prevent respiratory infections.  Keep all follow-up visits as told by your health care provider. This is important. Contact a health care provider if:  You have a rapid weight gain.  You have increasing shortness of breath that is unusual for you.  You are unable to participate in your usual physical activities.  You tire easily.  You cough more than normal, especially with physical activity.  You have any swelling or more swelling in areas such as your hands, feet, ankles, or abdomen.  You are unable to sleep because it is hard to breathe.  You feel like your heart is beating quickly (palpitations).  You become dizzy or light-headed when you stand up. Get help right away if:  You have difficulty breathing.  You notice or your family notices a change in your awareness, such as having trouble staying awake or having difficulty with concentration.  You have pain or discomfort in your chest.  You have an episode of fainting (syncope). This information is not intended to replace advice given to you by your health care provider. Make sure you discuss any questions you have with your health care provider. Document Released: 06/29/2005 Document Revised: 03/03/2016 Document Reviewed: 01/22/2016 Elsevier Interactive Patient Education  2017 [employer].

## 2017-05-29 LAB — CULTURE, BLOOD (ROUTINE X 2)
CULTURE: NO GROWTH
CULTURE: NO GROWTH
SPECIAL REQUESTS: ADEQUATE

## 2017-05-30 DIAGNOSIS — Z66 Do not resuscitate: Secondary | ICD-10-CM | POA: Diagnosis not present

## 2017-05-30 DIAGNOSIS — I119 Hypertensive heart disease without heart failure: Secondary | ICD-10-CM | POA: Diagnosis not present

## 2017-05-30 DIAGNOSIS — E119 Type 2 diabetes mellitus without complications: Secondary | ICD-10-CM | POA: Diagnosis not present

## 2017-05-30 DIAGNOSIS — I255 Ischemic cardiomyopathy: Secondary | ICD-10-CM | POA: Diagnosis not present

## 2017-05-30 DIAGNOSIS — I2510 Atherosclerotic heart disease of native coronary artery without angina pectoris: Secondary | ICD-10-CM | POA: Diagnosis not present

## 2017-05-30 DIAGNOSIS — I236 Thrombosis of atrium, auricular appendage, and ventricle as current complications following acute myocardial infarction: Secondary | ICD-10-CM | POA: Diagnosis not present

## 2017-05-30 DIAGNOSIS — Z95810 Presence of automatic (implantable) cardiac defibrillator: Secondary | ICD-10-CM | POA: Diagnosis not present

## 2017-05-30 DIAGNOSIS — I4891 Unspecified atrial fibrillation: Secondary | ICD-10-CM | POA: Diagnosis not present

## 2017-05-31 DIAGNOSIS — I2510 Atherosclerotic heart disease of native coronary artery without angina pectoris: Secondary | ICD-10-CM | POA: Diagnosis not present

## 2017-05-31 DIAGNOSIS — I255 Ischemic cardiomyopathy: Secondary | ICD-10-CM | POA: Diagnosis not present

## 2017-05-31 DIAGNOSIS — I119 Hypertensive heart disease without heart failure: Secondary | ICD-10-CM | POA: Diagnosis not present

## 2017-05-31 DIAGNOSIS — E119 Type 2 diabetes mellitus without complications: Secondary | ICD-10-CM | POA: Diagnosis not present

## 2017-05-31 DIAGNOSIS — I236 Thrombosis of atrium, auricular appendage, and ventricle as current complications following acute myocardial infarction: Secondary | ICD-10-CM | POA: Diagnosis not present

## 2017-05-31 DIAGNOSIS — I4891 Unspecified atrial fibrillation: Secondary | ICD-10-CM | POA: Diagnosis not present

## 2017-06-01 DIAGNOSIS — I236 Thrombosis of atrium, auricular appendage, and ventricle as current complications following acute myocardial infarction: Secondary | ICD-10-CM | POA: Diagnosis not present

## 2017-06-01 DIAGNOSIS — I4891 Unspecified atrial fibrillation: Secondary | ICD-10-CM | POA: Diagnosis not present

## 2017-06-01 DIAGNOSIS — I2510 Atherosclerotic heart disease of native coronary artery without angina pectoris: Secondary | ICD-10-CM | POA: Diagnosis not present

## 2017-06-01 DIAGNOSIS — I119 Hypertensive heart disease without heart failure: Secondary | ICD-10-CM | POA: Diagnosis not present

## 2017-06-01 DIAGNOSIS — I255 Ischemic cardiomyopathy: Secondary | ICD-10-CM | POA: Diagnosis not present

## 2017-06-01 DIAGNOSIS — E119 Type 2 diabetes mellitus without complications: Secondary | ICD-10-CM | POA: Diagnosis not present

## 2017-06-02 DIAGNOSIS — I119 Hypertensive heart disease without heart failure: Secondary | ICD-10-CM | POA: Diagnosis not present

## 2017-06-02 DIAGNOSIS — E119 Type 2 diabetes mellitus without complications: Secondary | ICD-10-CM | POA: Diagnosis not present

## 2017-06-02 DIAGNOSIS — I4891 Unspecified atrial fibrillation: Secondary | ICD-10-CM | POA: Diagnosis not present

## 2017-06-02 DIAGNOSIS — I255 Ischemic cardiomyopathy: Secondary | ICD-10-CM | POA: Diagnosis not present

## 2017-06-02 DIAGNOSIS — I236 Thrombosis of atrium, auricular appendage, and ventricle as current complications following acute myocardial infarction: Secondary | ICD-10-CM | POA: Diagnosis not present

## 2017-06-02 DIAGNOSIS — I2510 Atherosclerotic heart disease of native coronary artery without angina pectoris: Secondary | ICD-10-CM | POA: Diagnosis not present

## 2017-06-04 DIAGNOSIS — I236 Thrombosis of atrium, auricular appendage, and ventricle as current complications following acute myocardial infarction: Secondary | ICD-10-CM | POA: Diagnosis not present

## 2017-06-04 DIAGNOSIS — I2510 Atherosclerotic heart disease of native coronary artery without angina pectoris: Secondary | ICD-10-CM | POA: Diagnosis not present

## 2017-06-04 DIAGNOSIS — I4891 Unspecified atrial fibrillation: Secondary | ICD-10-CM | POA: Diagnosis not present

## 2017-06-04 DIAGNOSIS — I119 Hypertensive heart disease without heart failure: Secondary | ICD-10-CM | POA: Diagnosis not present

## 2017-06-04 DIAGNOSIS — I255 Ischemic cardiomyopathy: Secondary | ICD-10-CM | POA: Diagnosis not present

## 2017-06-04 DIAGNOSIS — E119 Type 2 diabetes mellitus without complications: Secondary | ICD-10-CM | POA: Diagnosis not present

## 2017-06-12 DEATH — deceased

## 2017-11-28 IMAGING — US US RENAL
1 series · 14 of 25 positions shown · non-contrast
Comparison: None.

CLINICAL DATA: Acute renal insufficiency. Assess for
hydronephrosis.

EXAM:
RENAL / URINARY TRACT ULTRASOUND COMPLETE

[Series 1: us renal · 0.30mm/px · 14 of 61 slices shown]
[im 1/61]
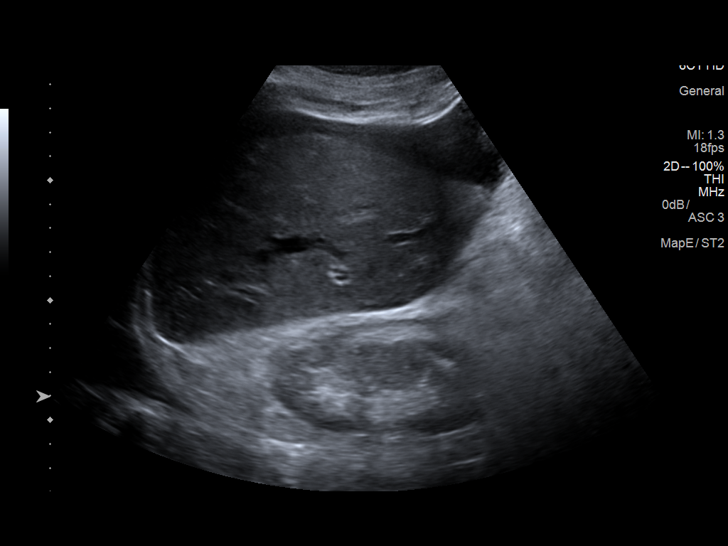
[im 6/61]
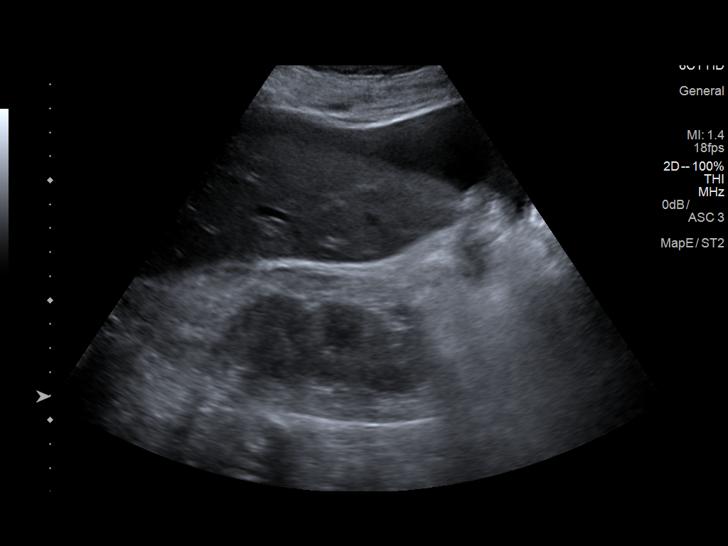
[im 11/61]
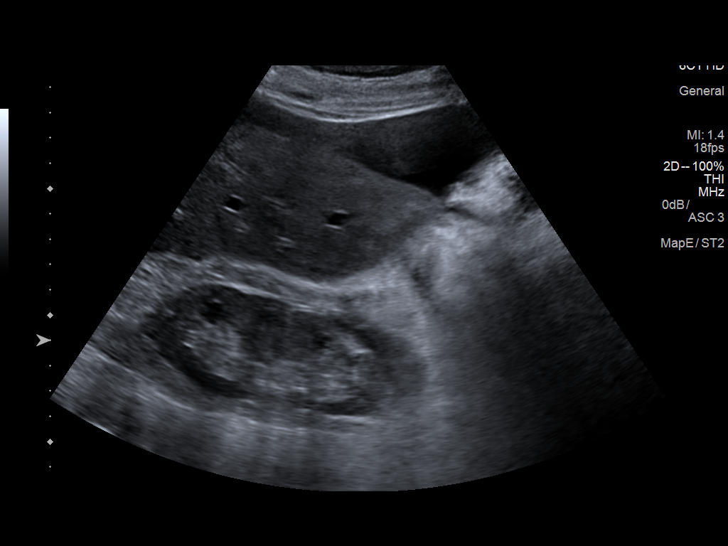
[im 16/61]
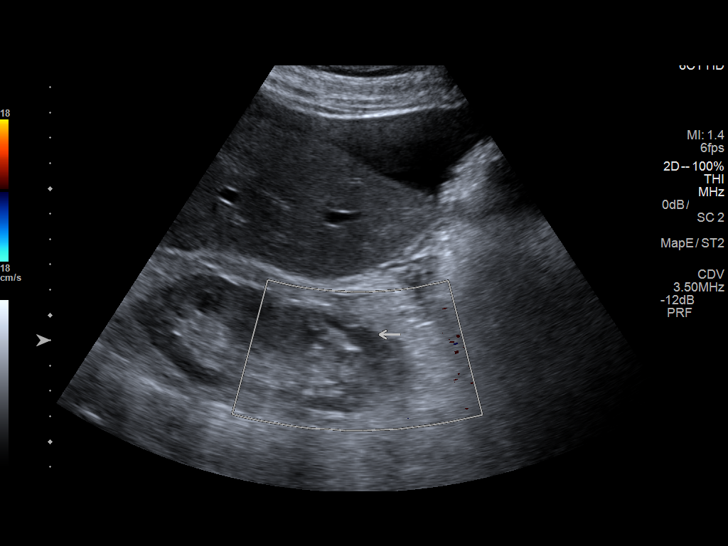
[im 21/61]
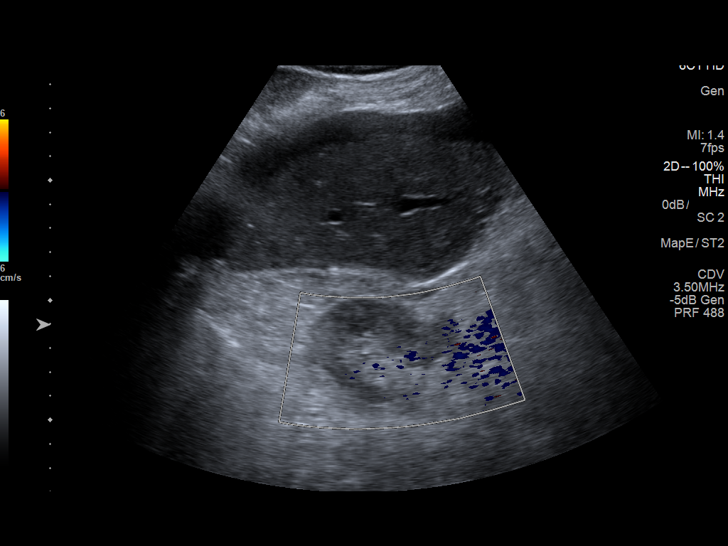
[im 23/61]
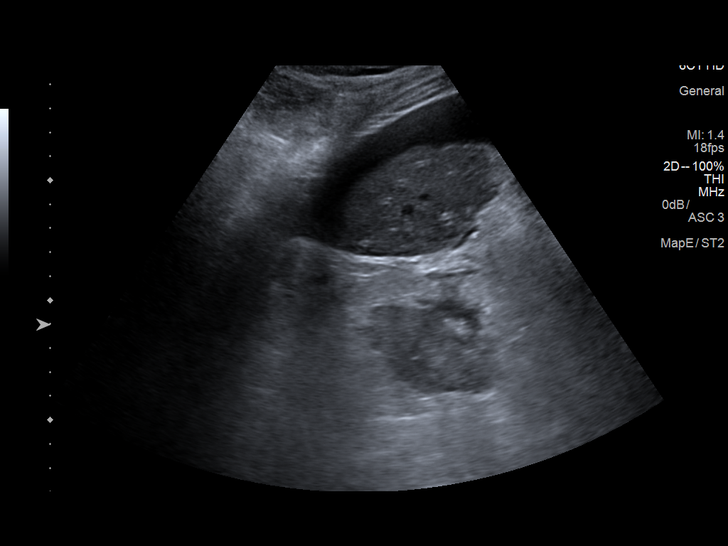
[im 28/61]
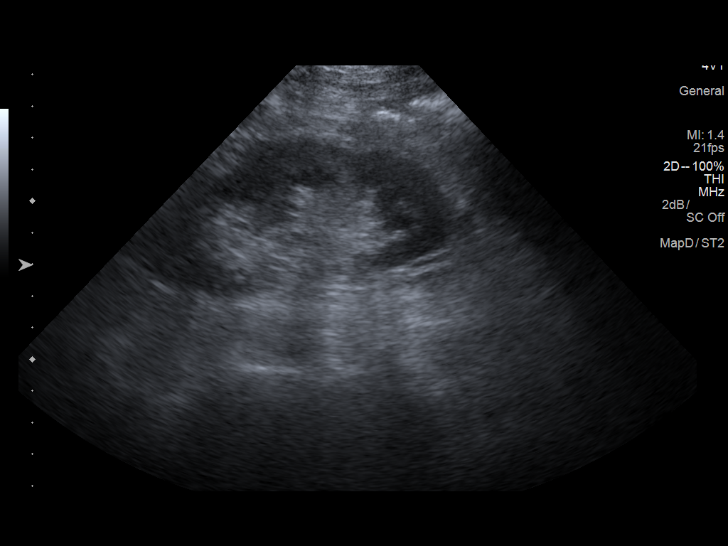
[im 33/61]
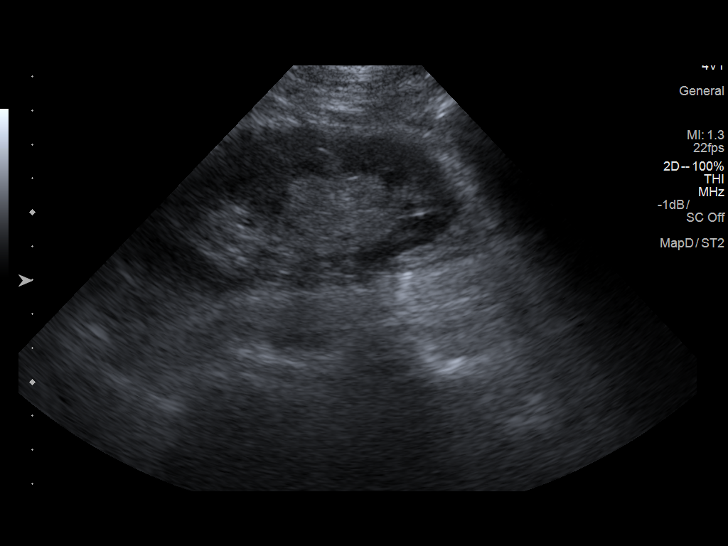
[im 38/61]
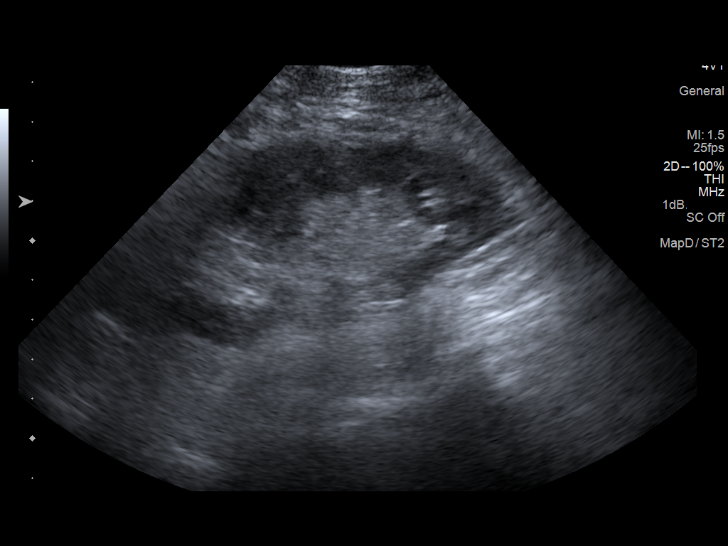
[im 41/61]
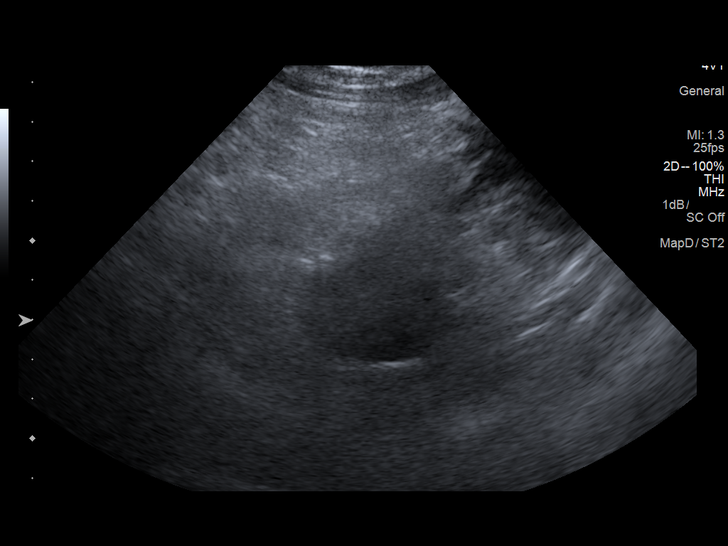
[im 46/61]
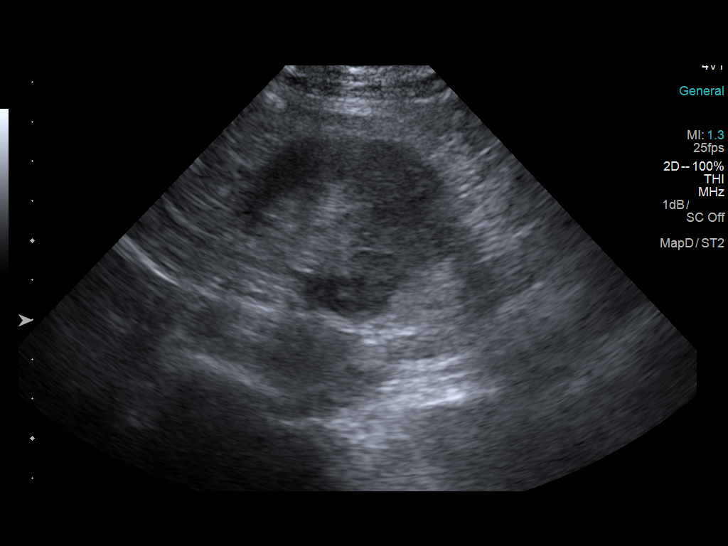
[im 51/61]
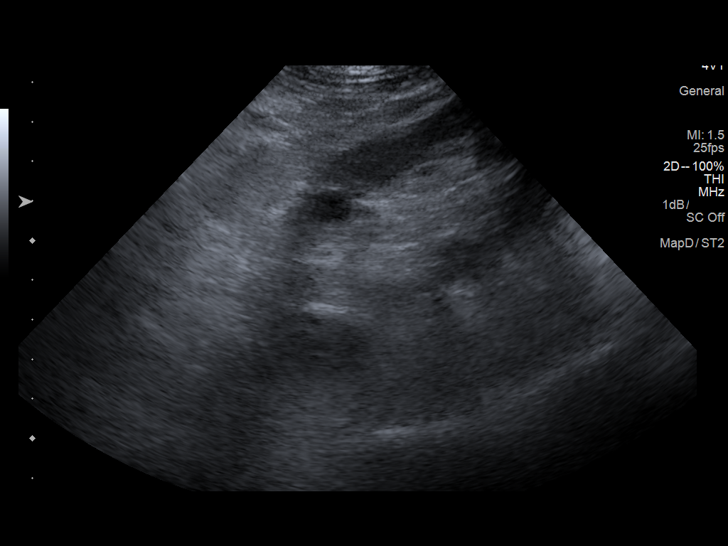
[im 56/61]
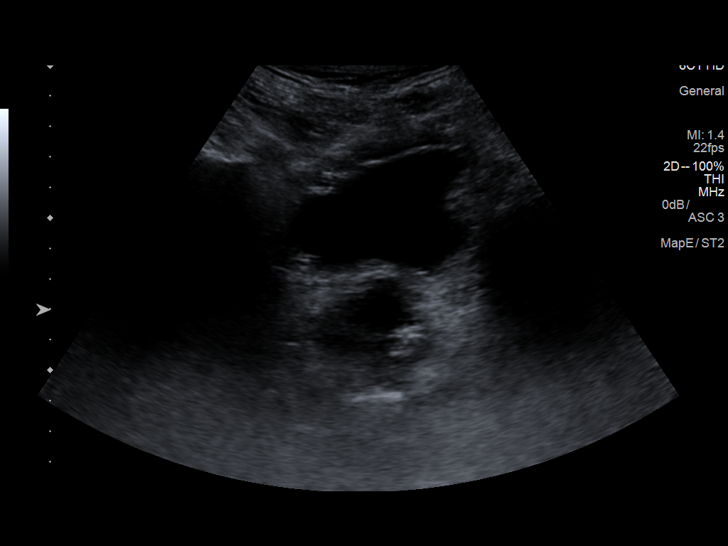
[im 61/61]
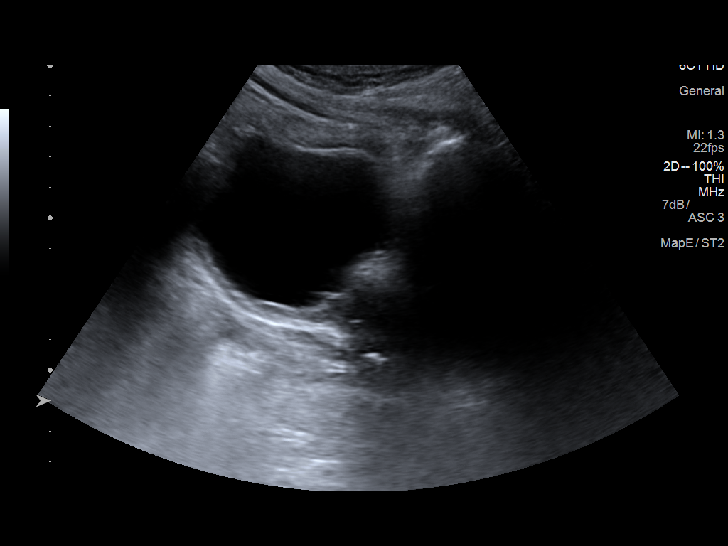

[14 of 25 positions shown; findings below may reference images not displayed]

FINDINGS: Right Kidney:

Length: 10.1 cm. Mild diffuse increased echotexture of right kidney
noted. Trace fluid is identified around the right kidney. No mass or
hydronephrosis visualized.

Left Kidney:

Length: 10.4 cm. Echogenicity within normal limits. No
hydronephrosis visualized. There is a 1.1 x 0.9 x 0.9 cm cyst in the
upper to midpole left kidney.

Bladder:

Appears normal for degree of bladder distention.

The prostate gland measures 4.1 x 3.2 cm.
IMPRESSION: Mild increased echotexture of the right kidney.

No hydronephrosis is identified bilaterally.

## 2018-01-26 IMAGING — DX DG CHEST 1V PORT
1 series · 1 of 1 positions shown · non-contrast
Comparison: Three days ago

CLINICAL DATA: Pneumonia

EXAM:
PORTABLE CHEST 1 VIEW

[chest ap]
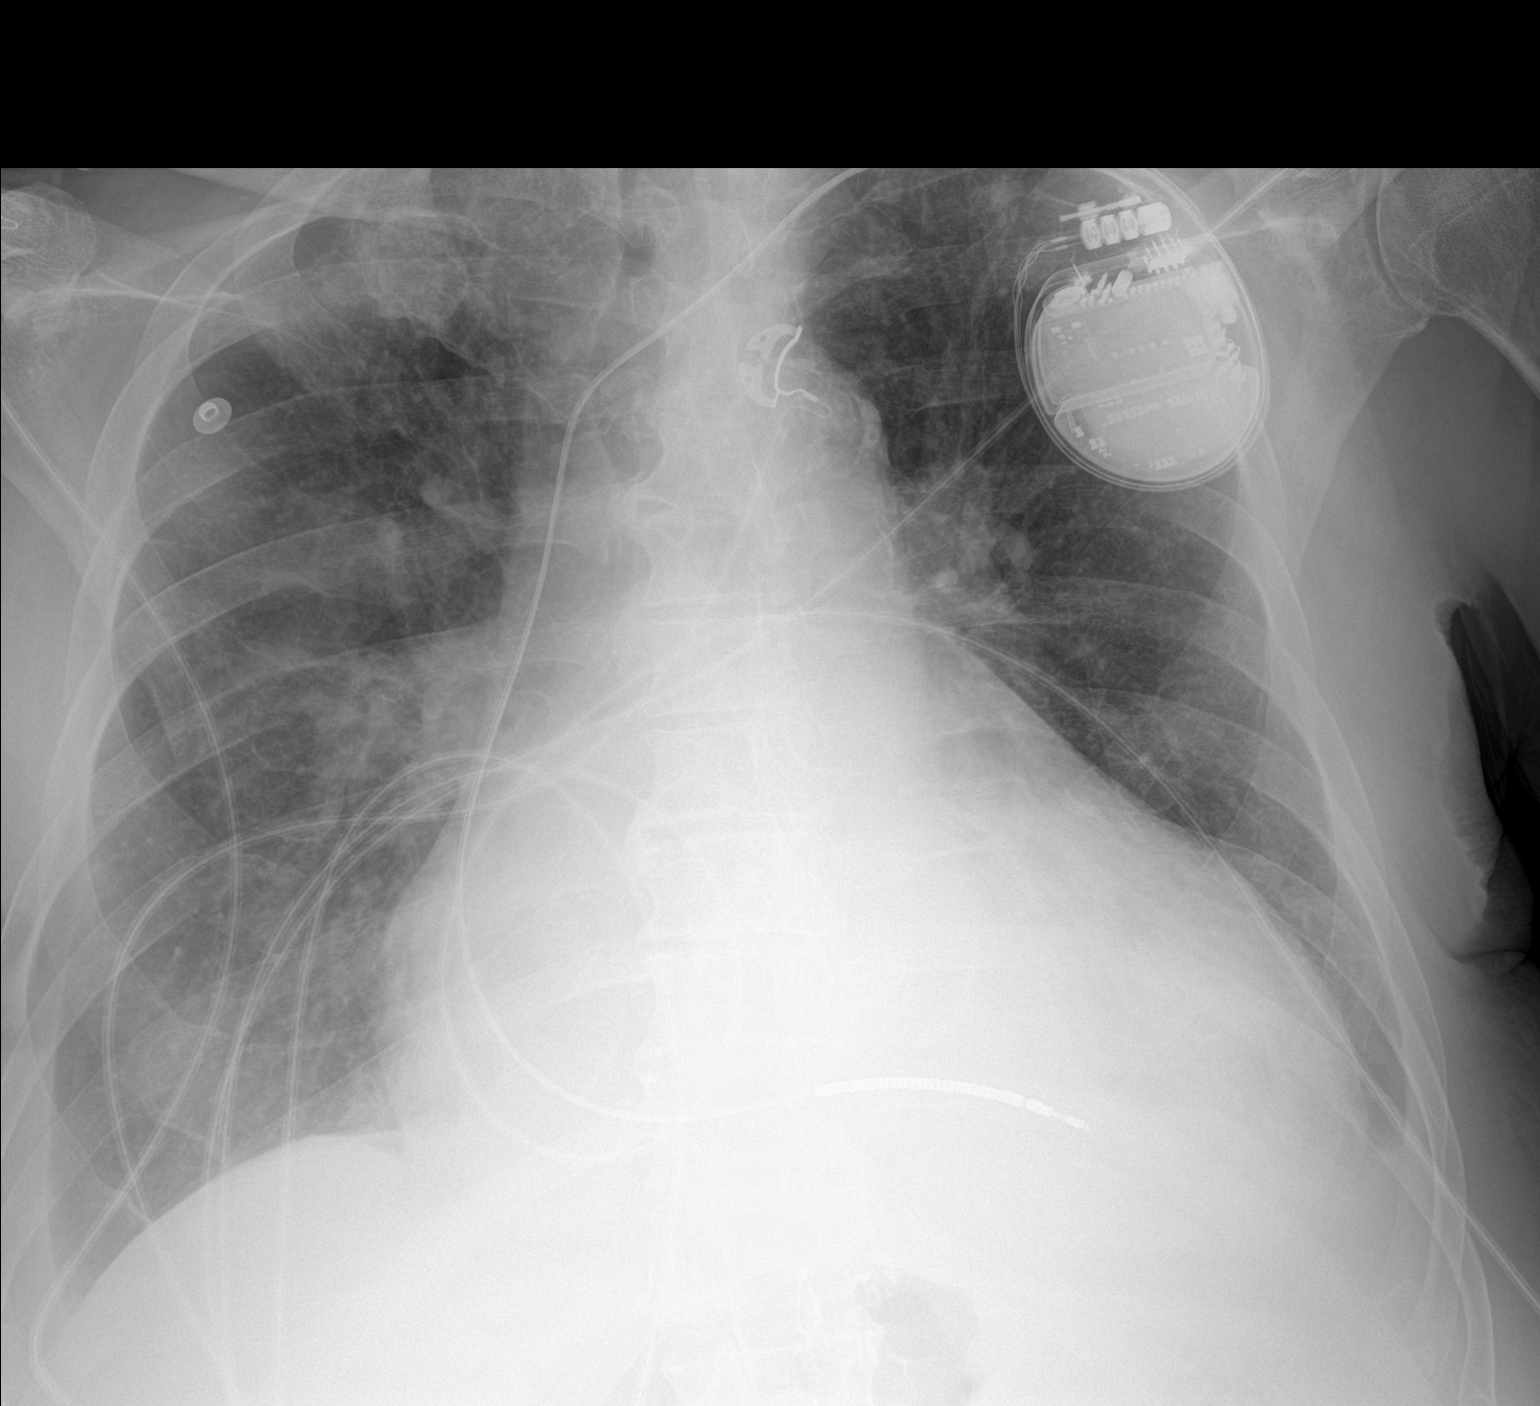

[1 of 1 positions shown; findings below may reference images not displayed]

FINDINGS: Single chamber pacer/ defibrillator from the left into the right
ventricle. Haziness of the chest from layering pleural effusions,
increased. Chronic cardiomegaly. No pneumothorax or air bronchogram.
IMPRESSION: 1. Increased layering pleural effusions when compared to 3 days
prior.
2. History of pneumonia with stable subtle asymmetric opacity in the
right lung.
